# Patient Record
Sex: Female | Born: 1970 | Race: White | Hispanic: No | Marital: Married | Smoking: Current every day smoker
Health system: Southern US, Community
[De-identification: ages and names within clinical notes are randomized; demographics above are authoritative.]

## PROBLEM LIST (undated history)

## (undated) DIAGNOSIS — N926 Irregular menstruation, unspecified: Secondary | ICD-10-CM

## (undated) DIAGNOSIS — Z8719 Personal history of other diseases of the digestive system: Secondary | ICD-10-CM

## (undated) DIAGNOSIS — E049 Nontoxic goiter, unspecified: Secondary | ICD-10-CM

## (undated) DIAGNOSIS — E162 Hypoglycemia, unspecified: Secondary | ICD-10-CM

## (undated) DIAGNOSIS — IMO0001 Reserved for inherently not codable concepts without codable children: Secondary | ICD-10-CM

## (undated) DIAGNOSIS — E785 Hyperlipidemia, unspecified: Secondary | ICD-10-CM

## (undated) DIAGNOSIS — I10 Essential (primary) hypertension: Secondary | ICD-10-CM

## (undated) DIAGNOSIS — K2270 Barrett's esophagus without dysplasia: Secondary | ICD-10-CM

## (undated) DIAGNOSIS — K449 Diaphragmatic hernia without obstruction or gangrene: Secondary | ICD-10-CM

## (undated) DIAGNOSIS — I7300 Raynaud's syndrome without gangrene: Secondary | ICD-10-CM

## (undated) DIAGNOSIS — E282 Polycystic ovarian syndrome: Secondary | ICD-10-CM

## (undated) DIAGNOSIS — Z9884 Bariatric surgery status: Secondary | ICD-10-CM

## (undated) DIAGNOSIS — K219 Gastro-esophageal reflux disease without esophagitis: Secondary | ICD-10-CM

## (undated) DIAGNOSIS — G4733 Obstructive sleep apnea (adult) (pediatric): Secondary | ICD-10-CM

## (undated) DIAGNOSIS — K7689 Other specified diseases of liver: Secondary | ICD-10-CM

## (undated) DIAGNOSIS — M1990 Unspecified osteoarthritis, unspecified site: Secondary | ICD-10-CM

## (undated) DIAGNOSIS — T7840XA Allergy, unspecified, initial encounter: Secondary | ICD-10-CM

## (undated) DIAGNOSIS — D509 Iron deficiency anemia, unspecified: Secondary | ICD-10-CM

## (undated) HISTORY — DX: Gastro-esophageal reflux disease without esophagitis: K21.9

## (undated) HISTORY — DX: Reserved for inherently not codable concepts without codable children: IMO0001

## (undated) HISTORY — DX: Essential (primary) hypertension: I10

## (undated) HISTORY — DX: Nontoxic goiter, unspecified: E04.9

## (undated) HISTORY — DX: Bariatric surgery status: Z98.84

## (undated) HISTORY — DX: Personal history of other diseases of the digestive system: Z87.19

## (undated) HISTORY — DX: Diaphragmatic hernia without obstruction or gangrene: K44.9

## (undated) HISTORY — DX: Obstructive sleep apnea (adult) (pediatric): G47.33

## (undated) HISTORY — DX: Polycystic ovarian syndrome: E28.2

## (undated) HISTORY — DX: Allergy, unspecified, initial encounter: T78.40XA

## (undated) HISTORY — DX: Iron deficiency anemia, unspecified: D50.9

## (undated) HISTORY — DX: Hyperlipidemia, unspecified: E78.5

## (undated) HISTORY — DX: Raynaud's syndrome without gangrene: I73.00

## (undated) HISTORY — DX: Barrett's esophagus without dysplasia: K22.70

## (undated) HISTORY — DX: Irregular menstruation, unspecified: N92.6

## (undated) HISTORY — DX: Hypoglycemia, unspecified: E16.2

## (undated) HISTORY — DX: Other specified diseases of liver: K76.89

## (undated) HISTORY — PX: NISSEN FUNDOPLICATION: SHX2091

## (undated) HISTORY — DX: Unspecified osteoarthritis, unspecified site: M19.90

---

## 1998-05-23 ENCOUNTER — Emergency Department (HOSPITAL_COMMUNITY): Admission: EM | Admit: 1998-05-23 | Discharge: 1998-05-23 | Payer: Self-pay | Admitting: Emergency Medicine

## 1999-02-01 ENCOUNTER — Encounter: Payer: Self-pay | Admitting: Family Medicine

## 1999-02-01 ENCOUNTER — Ambulatory Visit (HOSPITAL_COMMUNITY): Admission: RE | Admit: 1999-02-01 | Discharge: 1999-02-01 | Payer: Self-pay | Admitting: Family Medicine

## 1999-05-08 ENCOUNTER — Ambulatory Visit (HOSPITAL_COMMUNITY): Admission: RE | Admit: 1999-05-08 | Discharge: 1999-05-08 | Payer: Self-pay | Admitting: *Deleted

## 1999-05-10 ENCOUNTER — Emergency Department (HOSPITAL_COMMUNITY): Admission: EM | Admit: 1999-05-10 | Discharge: 1999-05-11 | Payer: Self-pay

## 2001-01-05 ENCOUNTER — Encounter: Admission: RE | Admit: 2001-01-05 | Discharge: 2001-01-05 | Payer: Self-pay | Admitting: Family Medicine

## 2001-01-05 ENCOUNTER — Encounter: Payer: Self-pay | Admitting: Family Medicine

## 2002-11-22 ENCOUNTER — Encounter: Admission: RE | Admit: 2002-11-22 | Discharge: 2003-02-20 | Payer: Self-pay | Admitting: Family Medicine

## 2002-12-27 ENCOUNTER — Ambulatory Visit (HOSPITAL_COMMUNITY): Admission: RE | Admit: 2002-12-27 | Discharge: 2002-12-27 | Payer: Self-pay | Admitting: Gastroenterology

## 2003-01-16 ENCOUNTER — Ambulatory Visit (HOSPITAL_COMMUNITY): Admission: RE | Admit: 2003-01-16 | Discharge: 2003-01-16 | Payer: Self-pay | Admitting: Gastroenterology

## 2003-03-13 ENCOUNTER — Encounter: Admission: RE | Admit: 2003-03-13 | Discharge: 2003-06-11 | Payer: Self-pay | Admitting: Family Medicine

## 2003-04-02 ENCOUNTER — Encounter: Payer: Self-pay | Admitting: Surgery

## 2003-04-04 ENCOUNTER — Inpatient Hospital Stay (HOSPITAL_COMMUNITY): Admission: RE | Admit: 2003-04-04 | Discharge: 2003-04-06 | Payer: Self-pay | Admitting: Surgery

## 2004-08-26 ENCOUNTER — Ambulatory Visit: Payer: Self-pay | Admitting: Family Medicine

## 2004-12-03 ENCOUNTER — Ambulatory Visit: Payer: Self-pay | Admitting: Family Medicine

## 2005-01-12 ENCOUNTER — Ambulatory Visit (HOSPITAL_BASED_OUTPATIENT_CLINIC_OR_DEPARTMENT_OTHER): Admission: RE | Admit: 2005-01-12 | Discharge: 2005-01-12 | Payer: Self-pay | Admitting: Family Medicine

## 2005-01-22 ENCOUNTER — Ambulatory Visit: Payer: Self-pay | Admitting: Pulmonary Disease

## 2005-01-26 ENCOUNTER — Ambulatory Visit: Payer: Self-pay | Admitting: Family Medicine

## 2005-02-02 ENCOUNTER — Ambulatory Visit: Payer: Self-pay | Admitting: Family Medicine

## 2005-02-17 ENCOUNTER — Ambulatory Visit: Payer: Self-pay | Admitting: *Deleted

## 2005-02-26 ENCOUNTER — Ambulatory Visit: Payer: Self-pay | Admitting: Family Medicine

## 2005-02-27 ENCOUNTER — Ambulatory Visit: Payer: Self-pay | Admitting: Internal Medicine

## 2005-03-18 ENCOUNTER — Ambulatory Visit (HOSPITAL_BASED_OUTPATIENT_CLINIC_OR_DEPARTMENT_OTHER): Admission: RE | Admit: 2005-03-18 | Discharge: 2005-03-18 | Payer: Self-pay | Admitting: Internal Medicine

## 2005-03-22 ENCOUNTER — Ambulatory Visit: Payer: Self-pay | Admitting: Internal Medicine

## 2005-04-01 ENCOUNTER — Ambulatory Visit: Payer: Self-pay | Admitting: Internal Medicine

## 2005-04-29 ENCOUNTER — Ambulatory Visit: Payer: Self-pay | Admitting: Internal Medicine

## 2005-06-24 ENCOUNTER — Ambulatory Visit: Payer: Self-pay | Admitting: Internal Medicine

## 2005-07-06 ENCOUNTER — Ambulatory Visit: Payer: Self-pay | Admitting: Family Medicine

## 2005-07-20 ENCOUNTER — Ambulatory Visit: Payer: Self-pay | Admitting: Family Medicine

## 2005-07-31 ENCOUNTER — Ambulatory Visit: Payer: Self-pay | Admitting: Family Medicine

## 2005-08-05 ENCOUNTER — Ambulatory Visit: Payer: Self-pay | Admitting: Family Medicine

## 2005-08-24 ENCOUNTER — Ambulatory Visit: Payer: Self-pay | Admitting: Family Medicine

## 2005-09-10 ENCOUNTER — Ambulatory Visit: Payer: Self-pay | Admitting: Family Medicine

## 2005-09-23 ENCOUNTER — Ambulatory Visit: Payer: Self-pay | Admitting: Family Medicine

## 2005-09-23 ENCOUNTER — Ambulatory Visit: Payer: Self-pay | Admitting: Internal Medicine

## 2005-09-23 ENCOUNTER — Encounter: Payer: Self-pay | Admitting: Gastroenterology

## 2005-10-05 ENCOUNTER — Ambulatory Visit: Payer: Self-pay | Admitting: Gastroenterology

## 2005-10-07 ENCOUNTER — Ambulatory Visit: Payer: Self-pay | Admitting: Cardiology

## 2005-11-13 ENCOUNTER — Ambulatory Visit: Payer: Self-pay | Admitting: Internal Medicine

## 2005-11-13 ENCOUNTER — Ambulatory Visit: Payer: Self-pay | Admitting: Family Medicine

## 2005-11-16 ENCOUNTER — Ambulatory Visit (HOSPITAL_COMMUNITY): Admission: RE | Admit: 2005-11-16 | Discharge: 2005-11-16 | Payer: Self-pay | Admitting: Surgery

## 2005-11-17 ENCOUNTER — Ambulatory Visit (HOSPITAL_COMMUNITY): Admission: RE | Admit: 2005-11-17 | Discharge: 2005-11-17 | Payer: Self-pay | Admitting: Surgery

## 2005-11-17 ENCOUNTER — Encounter: Admission: RE | Admit: 2005-11-17 | Discharge: 2006-02-15 | Payer: Self-pay | Admitting: Surgery

## 2006-01-01 ENCOUNTER — Ambulatory Visit: Payer: Self-pay | Admitting: Family Medicine

## 2006-01-28 ENCOUNTER — Ambulatory Visit: Payer: Self-pay | Admitting: Family Medicine

## 2006-03-16 ENCOUNTER — Encounter: Admission: RE | Admit: 2006-03-16 | Discharge: 2006-04-09 | Payer: Self-pay | Admitting: Surgery

## 2006-03-25 ENCOUNTER — Ambulatory Visit: Payer: Self-pay | Admitting: Family Medicine

## 2006-03-26 HISTORY — PX: ROUX-EN-Y GASTRIC BYPASS: SHX1104

## 2006-03-26 HISTORY — PX: HERNIA REPAIR: SHX51

## 2006-03-29 ENCOUNTER — Inpatient Hospital Stay (HOSPITAL_COMMUNITY): Admission: RE | Admit: 2006-03-29 | Discharge: 2006-04-03 | Payer: Self-pay | Admitting: Surgery

## 2006-03-29 ENCOUNTER — Encounter: Payer: Self-pay | Admitting: Gastroenterology

## 2006-03-29 ENCOUNTER — Encounter (INDEPENDENT_AMBULATORY_CARE_PROVIDER_SITE_OTHER): Payer: Self-pay | Admitting: *Deleted

## 2006-03-30 ENCOUNTER — Encounter: Payer: Self-pay | Admitting: Vascular Surgery

## 2006-04-08 ENCOUNTER — Ambulatory Visit: Payer: Self-pay | Admitting: Family Medicine

## 2006-05-12 ENCOUNTER — Ambulatory Visit: Payer: Self-pay | Admitting: Internal Medicine

## 2006-05-13 ENCOUNTER — Ambulatory Visit: Payer: Self-pay | Admitting: Family Medicine

## 2006-07-30 ENCOUNTER — Encounter: Admission: RE | Admit: 2006-07-30 | Discharge: 2006-10-28 | Payer: Self-pay | Admitting: Surgery

## 2006-08-02 ENCOUNTER — Ambulatory Visit: Payer: Self-pay | Admitting: Family Medicine

## 2006-08-02 ENCOUNTER — Encounter: Admission: RE | Admit: 2006-08-02 | Discharge: 2006-08-02 | Payer: Self-pay | Admitting: Family Medicine

## 2006-08-12 ENCOUNTER — Ambulatory Visit: Payer: Self-pay | Admitting: Internal Medicine

## 2006-09-02 ENCOUNTER — Ambulatory Visit: Payer: Self-pay | Admitting: Family Medicine

## 2006-11-05 ENCOUNTER — Ambulatory Visit: Payer: Self-pay | Admitting: Family Medicine

## 2006-11-17 ENCOUNTER — Ambulatory Visit: Payer: Self-pay | Admitting: Family Medicine

## 2006-11-17 LAB — CONVERTED CEMR LAB
ALT: 25 units/L (ref 0–40)
AST: 22 units/L (ref 0–37)
Albumin: 3.6 g/dL (ref 3.5–5.2)
Basophils Relative: 0.4 % (ref 0.0–1.0)
CO2: 29 meq/L (ref 19–32)
Calcium: 9.5 mg/dL (ref 8.4–10.5)
Chloride: 106 meq/L (ref 96–112)
Direct LDL: 151.6 mg/dL
Eosinophils Relative: 1.6 % (ref 0.0–5.0)
Folate: 11.6 ng/mL
GFR calc non Af Amer: 87 mL/min
Glucose, Bld: 82 mg/dL (ref 70–99)
Hemoglobin: 14.9 g/dL (ref 12.0–15.0)
MCHC: 35 g/dL (ref 30.0–36.0)
Monocytes Absolute: 0.4 10*3/uL (ref 0.2–0.7)
Platelets: 197 10*3/uL (ref 150–400)
Potassium: 3.6 meq/L (ref 3.5–5.1)
Sodium: 139 meq/L (ref 135–145)
TSH: 2.76 microintl units/mL (ref 0.35–5.50)
Transferrin: 228.4 mg/dL (ref >=212.0)
Triglycerides: 134 mg/dL (ref 0–149)
VLDL: 27 mg/dL (ref 0–40)
Vitamin B-12: 841 pg/mL (ref 211–911)

## 2006-11-24 DIAGNOSIS — IMO0001 Reserved for inherently not codable concepts without codable children: Secondary | ICD-10-CM

## 2006-11-24 DIAGNOSIS — K219 Gastro-esophageal reflux disease without esophagitis: Secondary | ICD-10-CM

## 2006-11-24 DIAGNOSIS — G4733 Obstructive sleep apnea (adult) (pediatric): Secondary | ICD-10-CM

## 2006-11-24 DIAGNOSIS — E282 Polycystic ovarian syndrome: Secondary | ICD-10-CM

## 2006-11-24 DIAGNOSIS — Z9884 Bariatric surgery status: Secondary | ICD-10-CM

## 2006-11-24 HISTORY — DX: Bariatric surgery status: Z98.84

## 2006-11-24 HISTORY — DX: Polycystic ovarian syndrome: E28.2

## 2006-11-24 HISTORY — DX: Gastro-esophageal reflux disease without esophagitis: K21.9

## 2006-11-24 HISTORY — DX: Obstructive sleep apnea (adult) (pediatric): G47.33

## 2006-11-24 HISTORY — DX: Reserved for inherently not codable concepts without codable children: IMO0001

## 2006-11-30 ENCOUNTER — Ambulatory Visit: Payer: Self-pay | Admitting: Family Medicine

## 2006-11-30 LAB — CONVERTED CEMR LAB: Cyclic Citrullin Peptide Ab: 4 units (ref <20)

## 2006-12-28 ENCOUNTER — Ambulatory Visit: Payer: Self-pay | Admitting: Family Medicine

## 2006-12-28 ENCOUNTER — Encounter: Admission: RE | Admit: 2006-12-28 | Discharge: 2006-12-28 | Payer: Self-pay | Admitting: Family Medicine

## 2006-12-28 LAB — CONVERTED CEMR LAB: Vit D, 1,25-Dihydroxy: 31 (ref 20–57)

## 2007-03-02 ENCOUNTER — Encounter: Admission: RE | Admit: 2007-03-02 | Discharge: 2007-03-02 | Payer: Self-pay | Admitting: Surgery

## 2007-03-23 ENCOUNTER — Encounter: Payer: Self-pay | Admitting: Family Medicine

## 2007-04-20 ENCOUNTER — Encounter: Admission: RE | Admit: 2007-04-20 | Discharge: 2007-04-20 | Payer: Self-pay | Admitting: Surgery

## 2007-05-10 ENCOUNTER — Ambulatory Visit: Payer: Self-pay | Admitting: Family Medicine

## 2007-05-10 DIAGNOSIS — N390 Urinary tract infection, site not specified: Secondary | ICD-10-CM | POA: Insufficient documentation

## 2007-05-10 DIAGNOSIS — L740 Miliaria rubra: Secondary | ICD-10-CM

## 2007-05-10 DIAGNOSIS — N926 Irregular menstruation, unspecified: Secondary | ICD-10-CM

## 2007-05-10 HISTORY — DX: Irregular menstruation, unspecified: N92.6

## 2007-05-10 LAB — CONVERTED CEMR LAB
Beta hcg, urine, semiquantitative: NEGATIVE
Bilirubin Urine: NEGATIVE

## 2007-05-13 ENCOUNTER — Telehealth (INDEPENDENT_AMBULATORY_CARE_PROVIDER_SITE_OTHER): Payer: Self-pay | Admitting: *Deleted

## 2007-05-13 ENCOUNTER — Encounter (INDEPENDENT_AMBULATORY_CARE_PROVIDER_SITE_OTHER): Payer: Self-pay | Admitting: *Deleted

## 2007-05-13 LAB — CONVERTED CEMR LAB
Calcium: 9.4 mg/dL (ref 8.4–10.5)
HCT: 42.3 % (ref 36.0–46.0)
Hemoglobin: 14.5 g/dL (ref 12.0–15.0)
Neutro Abs: 4.2 10*3/uL (ref 1.4–7.7)
Neutrophils Relative %: 58.5 % (ref 43.0–77.0)
Platelets: 203 10*3/uL (ref 150–400)
Potassium: 4.2 meq/L (ref 3.5–5.1)
TSH: 1.47 microintl units/mL (ref 0.35–5.50)
WBC: 7.1 10*3/uL (ref 4.5–10.5)

## 2007-07-28 ENCOUNTER — Encounter: Payer: Self-pay | Admitting: Family Medicine

## 2007-09-16 ENCOUNTER — Ambulatory Visit: Payer: Self-pay | Admitting: Family Medicine

## 2007-09-16 DIAGNOSIS — R5383 Other fatigue: Secondary | ICD-10-CM

## 2007-09-16 DIAGNOSIS — E049 Nontoxic goiter, unspecified: Secondary | ICD-10-CM | POA: Insufficient documentation

## 2007-09-16 DIAGNOSIS — R5381 Other malaise: Secondary | ICD-10-CM

## 2007-09-16 HISTORY — DX: Nontoxic goiter, unspecified: E04.9

## 2007-09-19 ENCOUNTER — Encounter (INDEPENDENT_AMBULATORY_CARE_PROVIDER_SITE_OTHER): Payer: Self-pay | Admitting: *Deleted

## 2007-09-19 LAB — CONVERTED CEMR LAB
BUN: 13 mg/dL (ref 6–23)
CO2: 25 meq/L (ref 19–32)
Calcium: 9 mg/dL (ref 8.4–10.5)
Creatinine, Ser: 0.73 mg/dL (ref 0.40–1.20)
Folate: >20 ng/mL
Free T4: 1.24 ng/dL (ref 0.89–1.80)
T3, Free: 2.9 pg/mL (ref 2.3–4.2)
TSH: 2.335 microintl units/mL (ref 0.350–5.50)
Vitamin B-12: 867 pg/mL (ref 211–911)

## 2007-09-20 ENCOUNTER — Encounter: Admission: RE | Admit: 2007-09-20 | Discharge: 2007-09-20 | Payer: Self-pay | Admitting: Family Medicine

## 2007-09-23 ENCOUNTER — Telehealth (INDEPENDENT_AMBULATORY_CARE_PROVIDER_SITE_OTHER): Payer: Self-pay | Admitting: *Deleted

## 2007-10-28 ENCOUNTER — Telehealth: Payer: Self-pay | Admitting: Internal Medicine

## 2007-11-08 ENCOUNTER — Encounter: Payer: Self-pay | Admitting: Family Medicine

## 2007-11-30 ENCOUNTER — Ambulatory Visit (HOSPITAL_BASED_OUTPATIENT_CLINIC_OR_DEPARTMENT_OTHER): Admission: RE | Admit: 2007-11-30 | Discharge: 2007-11-30 | Payer: Self-pay | Admitting: Rheumatology

## 2007-12-03 ENCOUNTER — Ambulatory Visit: Payer: Self-pay | Admitting: Internal Medicine

## 2008-01-09 ENCOUNTER — Encounter: Payer: Self-pay | Admitting: Gastroenterology

## 2008-01-11 ENCOUNTER — Ambulatory Visit: Payer: Self-pay | Admitting: Family Medicine

## 2008-01-11 DIAGNOSIS — E785 Hyperlipidemia, unspecified: Secondary | ICD-10-CM

## 2008-01-11 HISTORY — DX: Hyperlipidemia, unspecified: E78.5

## 2008-01-11 LAB — CONVERTED CEMR LAB
Bilirubin Urine: NEGATIVE
Ketones, urine, test strip: NEGATIVE
Nitrite: POSITIVE
Specific Gravity, Urine: <1.005
Urobilinogen, UA: NEGATIVE

## 2008-01-12 ENCOUNTER — Encounter: Payer: Self-pay | Admitting: Family Medicine

## 2008-01-13 ENCOUNTER — Telehealth (INDEPENDENT_AMBULATORY_CARE_PROVIDER_SITE_OTHER): Payer: Self-pay | Admitting: *Deleted

## 2008-01-20 ENCOUNTER — Telehealth (INDEPENDENT_AMBULATORY_CARE_PROVIDER_SITE_OTHER): Payer: Self-pay | Admitting: *Deleted

## 2008-01-22 LAB — CONVERTED CEMR LAB
ALT: 15 units/L (ref 0–35)
AST: 23 units/L (ref 0–37)
Albumin: 3.4 g/dL — ABNORMAL LOW (ref 3.5–5.2)
Basophils Absolute: 0.1 10*3/uL (ref 0.0–0.1)
Bilirubin, Direct: 0.1 mg/dL (ref 0.0–0.3)
Chloride: 105 meq/L (ref 96–112)
Creatinine, Ser: 0.8 mg/dL (ref 0.4–1.2)
Folate: >20 ng/mL
GFR calc non Af Amer: 86 mL/min
Glucose, Bld: 81 mg/dL (ref 70–99)
HDL: 44.9 mg/dL (ref >39.0)
MCHC: 33.4 g/dL (ref 30.0–36.0)
Monocytes Relative: 6.7 % (ref 3.0–11.0)
Neutro Abs: 3.3 10*3/uL (ref 1.4–7.7)
Neutrophils Relative %: 50.9 % (ref 43.0–77.0)
Platelets: 192 10*3/uL (ref 150–400)
RBC: 4.82 M/uL (ref 3.87–5.11)
RDW: 12.9 % (ref 11.5–14.6)
Total Bilirubin: 0.8 mg/dL (ref 0.3–1.2)
Total Protein: 6.7 g/dL (ref 6.0–8.3)
Triglycerides: 144 mg/dL (ref 0–149)
VLDL: 29 mg/dL (ref 0–40)

## 2008-01-23 ENCOUNTER — Encounter (INDEPENDENT_AMBULATORY_CARE_PROVIDER_SITE_OTHER): Payer: Self-pay | Admitting: *Deleted

## 2008-02-23 DIAGNOSIS — Z8719 Personal history of other diseases of the digestive system: Secondary | ICD-10-CM

## 2008-02-23 HISTORY — DX: Personal history of other diseases of the digestive system: Z87.19

## 2008-03-06 ENCOUNTER — Ambulatory Visit: Payer: Self-pay | Admitting: Gastroenterology

## 2008-03-06 DIAGNOSIS — K5900 Constipation, unspecified: Secondary | ICD-10-CM | POA: Insufficient documentation

## 2008-03-06 DIAGNOSIS — M533 Sacrococcygeal disorders, not elsewhere classified: Secondary | ICD-10-CM | POA: Insufficient documentation

## 2008-03-28 ENCOUNTER — Ambulatory Visit: Payer: Self-pay | Admitting: Gastroenterology

## 2008-06-14 ENCOUNTER — Ambulatory Visit: Payer: Self-pay | Admitting: Gastroenterology

## 2008-06-14 DIAGNOSIS — K7689 Other specified diseases of liver: Secondary | ICD-10-CM

## 2008-06-14 DIAGNOSIS — R1033 Periumbilical pain: Secondary | ICD-10-CM | POA: Insufficient documentation

## 2008-06-14 HISTORY — DX: Other specified diseases of liver: K76.89

## 2008-06-18 ENCOUNTER — Ambulatory Visit: Payer: Self-pay | Admitting: Family Medicine

## 2008-06-19 LAB — CONVERTED CEMR LAB: Vit D, 1,25-Dihydroxy: 33 (ref 30–89)

## 2008-06-20 ENCOUNTER — Encounter (INDEPENDENT_AMBULATORY_CARE_PROVIDER_SITE_OTHER): Payer: Self-pay | Admitting: *Deleted

## 2008-06-21 ENCOUNTER — Ambulatory Visit: Payer: Self-pay | Admitting: Gastroenterology

## 2008-06-21 ENCOUNTER — Encounter: Payer: Self-pay | Admitting: Gastroenterology

## 2008-06-22 ENCOUNTER — Encounter: Payer: Self-pay | Admitting: Gastroenterology

## 2008-06-25 LAB — CONVERTED CEMR LAB
Bilirubin, Direct: 0.1 mg/dL (ref 0.0–0.3)
Chloride: 110 meq/L (ref 96–112)
Direct LDL: 174.6 mg/dL
Eosinophils Absolute: 0.1 10*3/uL (ref 0.0–0.7)
GFR calc Af Amer: 104 mL/min
GFR calc non Af Amer: 86 mL/min
HDL: 48.6 mg/dL (ref >39.0)
Hemoglobin: 14.5 g/dL (ref 12.0–15.0)
MCHC: 34.5 g/dL (ref 30.0–36.0)
Neutro Abs: 2.8 10*3/uL (ref 1.4–7.7)
Platelets: 173 10*3/uL (ref 150–400)
RBC: 4.4 M/uL (ref 3.87–5.11)
Saturation Ratios: 25.1 % (ref 20.0–50.0)
VLDL: 22 mg/dL (ref 0–40)
WBC: 5.8 10*3/uL (ref 4.5–10.5)

## 2008-06-26 DIAGNOSIS — K2970 Gastritis, unspecified, without bleeding: Secondary | ICD-10-CM | POA: Insufficient documentation

## 2008-06-26 DIAGNOSIS — K449 Diaphragmatic hernia without obstruction or gangrene: Secondary | ICD-10-CM | POA: Insufficient documentation

## 2008-06-26 DIAGNOSIS — K2990 Gastroduodenitis, unspecified, without bleeding: Secondary | ICD-10-CM

## 2008-06-26 HISTORY — DX: Diaphragmatic hernia without obstruction or gangrene: K44.9

## 2008-07-24 ENCOUNTER — Encounter: Admission: RE | Admit: 2008-07-24 | Discharge: 2008-07-24 | Payer: Self-pay | Admitting: Surgery

## 2008-09-17 ENCOUNTER — Telehealth: Payer: Self-pay | Admitting: Gastroenterology

## 2008-10-10 ENCOUNTER — Encounter: Payer: Self-pay | Admitting: Family Medicine

## 2008-11-16 ENCOUNTER — Ambulatory Visit (HOSPITAL_COMMUNITY): Admission: RE | Admit: 2008-11-16 | Discharge: 2008-11-16 | Payer: Self-pay | Admitting: Obstetrics and Gynecology

## 2008-12-04 ENCOUNTER — Telehealth (INDEPENDENT_AMBULATORY_CARE_PROVIDER_SITE_OTHER): Payer: Self-pay | Admitting: *Deleted

## 2009-01-16 ENCOUNTER — Encounter: Payer: Self-pay | Admitting: Family Medicine

## 2009-01-17 ENCOUNTER — Encounter: Admission: RE | Admit: 2009-01-17 | Discharge: 2009-01-17 | Payer: Self-pay | Admitting: Internal Medicine

## 2009-02-15 ENCOUNTER — Telehealth (INDEPENDENT_AMBULATORY_CARE_PROVIDER_SITE_OTHER): Payer: Self-pay | Admitting: *Deleted

## 2009-02-18 ENCOUNTER — Encounter (INDEPENDENT_AMBULATORY_CARE_PROVIDER_SITE_OTHER): Payer: Self-pay | Admitting: Obstetrics and Gynecology

## 2009-02-18 HISTORY — PX: ABDOMINAL HYSTERECTOMY: SHX81

## 2009-02-18 HISTORY — PX: BLADDER SUSPENSION: SHX72

## 2009-02-19 ENCOUNTER — Inpatient Hospital Stay (HOSPITAL_COMMUNITY): Admission: RE | Admit: 2009-02-19 | Discharge: 2009-02-20 | Payer: Self-pay | Admitting: Obstetrics and Gynecology

## 2009-03-18 ENCOUNTER — Telehealth (INDEPENDENT_AMBULATORY_CARE_PROVIDER_SITE_OTHER): Payer: Self-pay | Admitting: *Deleted

## 2009-06-05 ENCOUNTER — Encounter: Payer: Self-pay | Admitting: Gastroenterology

## 2009-06-21 ENCOUNTER — Encounter: Admission: RE | Admit: 2009-06-21 | Discharge: 2009-06-21 | Payer: Self-pay | Admitting: Obstetrics and Gynecology

## 2009-06-25 ENCOUNTER — Ambulatory Visit: Payer: Self-pay | Admitting: Family Medicine

## 2009-06-25 DIAGNOSIS — R519 Headache, unspecified: Secondary | ICD-10-CM | POA: Insufficient documentation

## 2009-06-25 DIAGNOSIS — N3010 Interstitial cystitis (chronic) without hematuria: Secondary | ICD-10-CM | POA: Insufficient documentation

## 2009-06-25 DIAGNOSIS — R209 Unspecified disturbances of skin sensation: Secondary | ICD-10-CM

## 2009-06-25 DIAGNOSIS — R197 Diarrhea, unspecified: Secondary | ICD-10-CM

## 2009-06-25 DIAGNOSIS — R51 Headache: Secondary | ICD-10-CM

## 2009-07-02 ENCOUNTER — Encounter (INDEPENDENT_AMBULATORY_CARE_PROVIDER_SITE_OTHER): Payer: Self-pay | Admitting: *Deleted

## 2009-07-02 LAB — CONVERTED CEMR LAB
AST: 20 units/L (ref 0–37)
BUN: 13 mg/dL (ref 6–23)
Basophils Relative: 0.2 % (ref 0.0–3.0)
Chloride: 104 meq/L (ref 96–112)
Direct LDL: 157.1 mg/dL
Eosinophils Absolute: 0.3 10*3/uL (ref 0.0–0.7)
GFR calc non Af Amer: 99.77 mL/min (ref >60)
Iron: 80 ug/dL (ref 42–145)
MCHC: 33.5 g/dL (ref 30.0–36.0)
Neutro Abs: 1.4 10*3/uL (ref 1.4–7.7)
Neutrophils Relative %: 30.9 % — ABNORMAL LOW (ref 43.0–77.0)
Platelets: 179 10*3/uL (ref 150.0–400.0)
RBC: 4.31 M/uL (ref 3.87–5.11)
RDW: 11.8 % (ref 11.5–14.6)
Total Bilirubin: 0.9 mg/dL (ref 0.3–1.2)
Total CHOL/HDL Ratio: 3
Total Protein: 6.7 g/dL (ref 6.0–8.3)
Transferrin: 255.2 mg/dL (ref 212.0–360.0)
Triglycerides: 79 mg/dL (ref 0.0–149.0)
Vitamin B-12: 1073 pg/mL — ABNORMAL HIGH (ref 211–911)
WBC: 4.6 10*3/uL (ref 4.5–10.5)

## 2009-07-03 ENCOUNTER — Ambulatory Visit: Payer: Self-pay | Admitting: Family Medicine

## 2009-07-03 ENCOUNTER — Encounter (INDEPENDENT_AMBULATORY_CARE_PROVIDER_SITE_OTHER): Payer: Self-pay | Admitting: *Deleted

## 2009-07-03 LAB — CONVERTED CEMR LAB
OCCULT 1: NEGATIVE
OCCULT 2: NEGATIVE
OCCULT 3: NEGATIVE

## 2009-07-04 ENCOUNTER — Encounter: Payer: Self-pay | Admitting: Family Medicine

## 2009-07-05 ENCOUNTER — Telehealth (INDEPENDENT_AMBULATORY_CARE_PROVIDER_SITE_OTHER): Payer: Self-pay | Admitting: *Deleted

## 2009-07-08 ENCOUNTER — Encounter: Payer: Self-pay | Admitting: Family Medicine

## 2009-07-09 ENCOUNTER — Encounter: Payer: Self-pay | Admitting: Family Medicine

## 2009-08-06 ENCOUNTER — Encounter: Payer: Self-pay | Admitting: Family Medicine

## 2009-08-23 ENCOUNTER — Encounter: Payer: Self-pay | Admitting: Family Medicine

## 2009-08-30 ENCOUNTER — Encounter: Payer: Self-pay | Admitting: Family Medicine

## 2009-09-05 ENCOUNTER — Encounter: Payer: Self-pay | Admitting: Family Medicine

## 2009-09-26 ENCOUNTER — Encounter: Payer: Self-pay | Admitting: Family Medicine

## 2009-11-07 ENCOUNTER — Encounter: Payer: Self-pay | Admitting: Family Medicine

## 2009-12-05 ENCOUNTER — Encounter: Payer: Self-pay | Admitting: Family Medicine

## 2010-01-07 ENCOUNTER — Encounter: Payer: Self-pay | Admitting: Family Medicine

## 2010-01-28 ENCOUNTER — Ambulatory Visit: Payer: Self-pay | Admitting: Family Medicine

## 2010-01-28 DIAGNOSIS — I7300 Raynaud's syndrome without gangrene: Secondary | ICD-10-CM

## 2010-01-28 DIAGNOSIS — H6590 Unspecified nonsuppurative otitis media, unspecified ear: Secondary | ICD-10-CM | POA: Insufficient documentation

## 2010-01-28 HISTORY — DX: Raynaud's syndrome without gangrene: I73.00

## 2010-01-30 LAB — CONVERTED CEMR LAB
BUN: 7 mg/dL (ref 6–23)
Basophils Relative: 0.4 % (ref 0.0–3.0)
Eosinophils Absolute: 0.2 10*3/uL (ref 0.0–0.7)
Ferritin: 45.9 ng/mL (ref 10.0–291.0)
Glucose, Bld: 88 mg/dL (ref 70–99)
Lymphocytes Relative: 42.9 % (ref 12.0–46.0)
MCV: 92.9 fL (ref 78.0–100.0)
Neutro Abs: 2.2 10*3/uL (ref 1.4–7.7)
Neutrophils Relative %: 44.4 % (ref 43.0–77.0)
Platelets: 179 10*3/uL (ref 150.0–400.0)
RDW: 14.2 % (ref 11.5–14.6)
Rhuematoid fact SerPl-aCnc: 25 intl units/mL — ABNORMAL HIGH (ref 0.0–20.0)
Saturation Ratios: 16.6 % — ABNORMAL LOW (ref 20.0–50.0)
WBC: 4.9 10*3/uL (ref 4.5–10.5)

## 2010-02-10 ENCOUNTER — Ambulatory Visit: Payer: Self-pay | Admitting: Gastroenterology

## 2010-02-10 DIAGNOSIS — K2270 Barrett's esophagus without dysplasia: Secondary | ICD-10-CM | POA: Insufficient documentation

## 2010-02-10 DIAGNOSIS — D509 Iron deficiency anemia, unspecified: Secondary | ICD-10-CM

## 2010-02-10 HISTORY — DX: Iron deficiency anemia, unspecified: D50.9

## 2010-02-10 HISTORY — DX: Barrett's esophagus without dysplasia: K22.70

## 2010-03-04 ENCOUNTER — Encounter: Payer: Self-pay | Admitting: Family Medicine

## 2010-03-06 ENCOUNTER — Encounter: Payer: Self-pay | Admitting: Family Medicine

## 2010-04-29 ENCOUNTER — Encounter: Payer: Self-pay | Admitting: Family Medicine

## 2010-06-03 ENCOUNTER — Encounter: Payer: Self-pay | Admitting: Family Medicine

## 2010-07-29 ENCOUNTER — Ambulatory Visit: Payer: Self-pay | Admitting: Family Medicine

## 2010-07-29 DIAGNOSIS — J309 Allergic rhinitis, unspecified: Secondary | ICD-10-CM | POA: Insufficient documentation

## 2010-08-05 ENCOUNTER — Encounter: Payer: Self-pay | Admitting: Family Medicine

## 2010-08-11 ENCOUNTER — Telehealth: Payer: Self-pay | Admitting: Family Medicine

## 2010-09-09 ENCOUNTER — Encounter: Payer: Self-pay | Admitting: Family Medicine

## 2010-09-26 ENCOUNTER — Ambulatory Visit: Payer: Self-pay | Admitting: Family Medicine

## 2010-09-26 ENCOUNTER — Ambulatory Visit (HOSPITAL_BASED_OUTPATIENT_CLINIC_OR_DEPARTMENT_OTHER)
Admission: RE | Admit: 2010-09-26 | Discharge: 2010-09-26 | Payer: Self-pay | Source: Home / Self Care | Admitting: Family Medicine

## 2010-09-26 DIAGNOSIS — R079 Chest pain, unspecified: Secondary | ICD-10-CM | POA: Insufficient documentation

## 2010-10-02 ENCOUNTER — Encounter: Payer: Self-pay | Admitting: Family Medicine

## 2010-11-25 NOTE — Letter (Signed)
Summary: [facility]  [facility]   Imported By: Lanelle Bal 12/12/2009 12:52:51  _____________________________________________________________________  External Attachment:    Type:   Image     Comment:   External Document

## 2010-11-25 NOTE — Letter (Signed)
Summary: [facility]  [facility]   Imported By: Lanelle Bal 01/13/2010 12:32:47  _____________________________________________________________________  External Attachment:    Type:   Image     Comment:   External Document

## 2010-11-25 NOTE — Assessment & Plan Note (Signed)
Summary: FOR SINUS//PH   Vital Signs:  Patient profile:   40 year old female Weight:      108.2 pounds O2 Sat:      98 % on Room air Temp:     98.5 degrees F oral Pulse rate:   90 / minute Pulse rhythm:   regular BP sitting:   120 / 82  (left arm) Cuff size:   regular  Vitals Entered By: Almeta Monas CMA Duncan Dull) (July 29, 2010 3:01 PM)  O2 Flow:  Room air CC: c/o sinus pressure, congestion, cough with yellow phelgm and feeling tired, URI symptoms   History of Present Illness:       This is a 40 year old woman who presents with URI symptoms.  The symptoms began 2 weeks ago.  Pt has tried zyrtecD, zyrtec , mucinex with no relief. + max sinus pressure b/l .  The patient complains of nasal congestion, purulent nasal discharge, sore throat, productive cough, and sick contacts, but denies dry cough.  The patient denies fever, low-grade fever (<100.5 degrees), fever of 100.5-103 degrees, fever of 103.1-104 degrees, fever to >104 degrees, stiff neck, dyspnea, wheezing, rash, vomiting, diarrhea, use of an antipyretic, and response to antipyretic.  The patient also reports itchy watery eyes, itchy throat, headache, and severe fatigue.  The patient denies response to antihistamine and muscle aches.  The patient denies the following risk factors for Strep sinusitis: unilateral facial pain, unilateral nasal discharge, poor response to decongestant, double sickening, tooth pain, Strep exposure, tender adenopathy, and absence of cough.    Current Medications (verified): 1)  Ultram Er 100 Mg Xr24h-Tab (Tramadol Hcl) .... One Tablet By Mouth Three Times A Day 2)  Valtrex 1 Gm  Tabs (Valacyclovir Hcl) .Marland Kitchen.. 1 By Mouth Once Daily 3)  Skelaxin 800 Mg  Tabs (Metaxalone) .... One Tab By Mouth Two Times A Day As Needed 4)  Temazepam 7.5 Mg  Caps (Temazepam) .... Take 2 Tablets By Mouth At Bedtime 5)  Vitamin D 1000 Unit  Caps (Cholecalciferol) .... One Cap Two Times A Day 6)  Fish Oil 1200 Mg Caps (Omega-3  Fatty Acids) .... One Capsule By Mouth Two Times A Day 7)  Omeprazole 20 Mg  Cpdr (Omeprazole) .... One Tablet By Mouth Two Times A Day 8)  Elmiron 100 Mg Caps (Pentosan Polysulfate Sodium) .... 200mg  Two Times A Day 9)  Tums Calcium For Life Bone 750 Mg Chew (Calcium Carbonate Antacid) .... Two Tablets By Mouth At Bedtime 10)  Flector 1.3 % Ptch (Diclofenac Epolamine) .... Once Daily 11)  Vitamin E 600 Unit  Caps (Vitamin E) .... One Capsule By Mouth Once Daily 12)  Voltaren 1 % Gel (Diclofenac Sodium) .... As Directed 13)  Melatonin 5 Mg Tabs (Melatonin) .... One Tablet By Mouth At Bedtime 14)  Nucynta 50 Mg Tabs (Tapentadol Hcl) .... One Tablet By Mouth Three Times A Day 15)  Nuvigil 250 Mg Tabs (Armodafinil) .... As Needed Once Daily 16)  Vesicare 5 Mg Tabs (Solifenacin Succinate) .... By Mouth Daily 17)  Amitriptyline Hcl .... Pt Usure of Dose 18)  Testosterone .... Injections in Office 19)  Flonase 50 Mcg/act Susp (Fluticasone Propionate) .... 2  Sprays Each Nostril Once Daily 20)  Allegra Allergy 180 Mg Tabs (Fexofenadine Hcl) .Marland Kitchen.. 1 By Mouth Once Daily  Allergies (verified): 1)  ! Neurontin (Gabapentin) 2)  ! Nsaids 3)  ! Asa 4)  ! Cipro  Past History:  Past medical, surgical, family and  social histories (including risk factors) reviewed for relevance to current acute and chronic problems.  Past Medical History: Reviewed history from 02/10/2010 and no changes required. 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 Caesarean  section 1994 Roux-en-Y gastric bypass with takedown of Nissen fundoplication 03/2006 Umbilical hernia repair 03/2006 Nissen fundoplication Hysterectomy (02/18/2009)- TAH BSO-prolapse Bladder Sling--Dr MacDermot4/26/2010  Family History: Reviewed history from 06/14/2008 and no changes required. No FH of Colon Cancer: Family History of Pancreatic Cancer: Paternal Grandfather Family History of Diabetes: Father, Maternal Grandmother Family History of Heart Disease: Father  Social History: Reviewed history from 06/14/2008 and no changes required. Occupation: Unemployeed Patient is a former smoker. -stopped 2004 Alcohol Use - no Illicit Drug Use - no Patient does not get regular exercise.   Review of Systems      See HPI  Physical Exam  General:  Well-developed,well-nourished,in no acute distress; alert,appropriate and cooperative throughout examination Ears:  External ear exam shows no significant lesions or deformities.  Otoscopic examination reveals clear canals, tympanic membranes are intact bilaterally without bulging, retraction, inflammation or discharge. Hearing is grossly normal bilaterally. Nose:  mucosal erythema, mucosal edema, L maxillary sinus tenderness, and R maxillary sinus tenderness.   Mouth:  pharyngeal erythema.   Neck:  supple, full ROM, and cervical lymphadenopathy.   Lungs:  Normal respiratory effort, chest expands symmetrically. Lungs are clear to auscultation, no crackles or wheezes. Heart:  normal rate and no murmur.     Impression & Recommendations:  Problem # 1:  ALLERGIC RHINITIS CAUSE UNSPECIFIED (ICD-477.9)  The following medications were removed from the medication list:    Nasonex 50 Mcg/act Susp (Mometasone furoate) .Marland Kitchen... 2 sprays each nostril once daily Her updated medication list for this problem includes:    Flonase 50 Mcg/act Susp (Fluticasone propionate) .Marland Kitchen... 2  sprays each nostril once daily    Allegra Allergy 180 Mg Tabs (Fexofenadine  hcl) .Marland Kitchen... 1 by mouth once daily  Discussed use of allergy medications and environmental measures.   Orders: Admin of Therapeutic Inj  intramuscular or subcutaneous (52841) Depo- Medrol 80mg  (J1040)  Complete Medication List: 1)  Ultram Er 100 Mg Xr24h-tab (Tramadol hcl) .... One tablet by mouth three times a day 2)  Valtrex 1 Gm Tabs (Valacyclovir hcl) .Marland Kitchen.. 1 by mouth once daily 3)  Skelaxin 800 Mg Tabs (Metaxalone) .... One tab by mouth two times a day as needed 4)  Temazepam 7.5 Mg Caps (Temazepam) .... Take 2 tablets by mouth at bedtime 5)  Vitamin D 1000 Unit Caps (Cholecalciferol) .... One cap two times a day 6)  Fish Oil 1200 Mg Caps (Omega-3 fatty acids) .... One capsule by mouth two times a day 7)  Omeprazole 20 Mg Cpdr (Omeprazole) .... One tablet by mouth two times a day 8)  Elmiron 100 Mg Caps (Pentosan polysulfate sodium) .... 200mg  two times a day 9)  Tums Calcium For Life Bone 750 Mg Chew (Calcium carbonate antacid) .... Two tablets by mouth at bedtime 10)  Flector 1.3 % Ptch (Diclofenac epolamine) .... Once daily 11)  Vitamin E 600 Unit Caps (Vitamin e) .... One capsule by mouth once daily 12)  Voltaren 1 % Gel (Diclofenac sodium) .... As directed 13)  Melatonin  5 Mg Tabs (Melatonin) .... One tablet by mouth at bedtime 14)  Nucynta 50 Mg Tabs (Tapentadol hcl) .... One tablet by mouth three times a day 15)  Nuvigil 250 Mg Tabs (Armodafinil) .... As needed once daily 16)  Vesicare 5 Mg Tabs (Solifenacin succinate) .... By mouth daily 17)  Amitriptyline Hcl  .... Pt usure of dose 18)  Testosterone  .... Injections in office 19)  Flonase 50 Mcg/act Susp (Fluticasone propionate) .... 2  sprays each nostril once daily 20)  Allegra Allergy 180 Mg Tabs (Fexofenadine hcl) .Marland Kitchen.. 1 by mouth once daily  Patient Instructions: 1)  Get plenty of rest, drink lots of clear liquids, and use Tylenol or Ibuprofen for fever and comfort. Return in 7-10 days if you're not better: sooner if  you'er feeling worse.  Prescriptions: FLONASE 50 MCG/ACT SUSP (FLUTICASONE PROPIONATE) 2  sprays each nostril once daily  #1 x 1   Entered and Authorized by:   Loreen Freud DO   Signed by:   Loreen Freud DO on 07/29/2010   Method used:   [facility]  [address] [phone]ail[facility]       [address]       [place], [place]  [address]       Ph: [phone] or [phone]       Fax: [phone]   RxID:   [id number]    Medication Administration  Injection # 1:    Medication: Depo- Medrol 80mg     Diagnosis: ALLERGIC RHINITIS CAUSE UNSPECIFIED (ICD-477.9)    Route: IM    Site: RUOQ gluteus    Exp Date: 04/26/2011    Lot #: [id number]    Mfr: [employer]    Patient tolerated injection without complications    Given by: Jeremy Johann CMA (July 29, 2010 3:27 PM)  Orders Added: 1)  Admin of Therapeutic Inj  intramuscular or subcutaneous [96372] 2)  Depo- Medrol 80mg  [J1040] 3)  Est. Patient Level III [62831]

## 2010-11-25 NOTE — Op Note (Signed)
[facility]   Imported By: Sherian Rein 02/11/2010 10:38:53  _____________________________________________________________________  External Attachment:    Type:   Image     Comment:   External Document

## 2010-11-25 NOTE — Letter (Signed)
Summary: [facility]  [facility]   Imported By: Lanelle Bal 06/16/2010 10:06:10  _____________________________________________________________________  External Attachment:    Type:   Image     Comment:   External Document

## 2010-11-25 NOTE — Letter (Signed)
Summary: [facility]  [facility]   Imported By: Lanelle Bal 03/17/2010 09:50:13  _____________________________________________________________________  External Attachment:    Type:   Image     Comment:   External Document

## 2010-11-25 NOTE — Letter (Signed)
Summary: [facility]  [facility]   Imported By: Lanelle Bal 11/18/2009 09:12:27  _____________________________________________________________________  External Attachment:    Type:   Image     Comment:   External Document

## 2010-11-25 NOTE — Letter (Signed)
Summary: [facility]  [facility]   Imported By: Lanelle Bal 08/15/2010 11:31:50  _____________________________________________________________________  External Attachment:    Type:   Image     Comment:   External Document

## 2010-11-25 NOTE — Assessment & Plan Note (Signed)
Summary: evaluate Prilosec...as.   History of Present Illness Visit Type: Follow-up Visit Primary GI MD: Claudette Head, MD [facility] Primary Provider: Loreen Freud, DO Requesting Provider: n/a Chief Complaint: GERD and pt states that she needs Prilosec refilled  History of Present Illness:   This 40 year old female presents for followup of GERD. She states she is having frequent breakthrough symptoms late in the evening and early in the morning. She was recently found to have mild iron deficiency without anemia. Colonoscopy and EGD were performed in 2009.   GI Review of Systems    Reports heartburn.      Denies abdominal pain, acid reflux, belching, bloating, chest pain, dysphagia with liquids, dysphagia with solids, loss of appetite, nausea, vomiting, vomiting blood, weight loss, and  weight gain.        Denies anal fissure, black tarry stools, change in bowel habit, constipation, diarrhea, diverticulosis, fecal incontinence, heme positive stool, hemorrhoids, irritable bowel syndrome, jaundice, light color stool, liver problems, rectal bleeding, and  rectal pain.   Current Medications (verified): 1)  Ultram Er 100 Mg Xr24h-Tab (Tramadol Hcl) .... One Tablet By Mouth Three Times A Day 2)  Valtrex 1 Gm  Tabs (Valacyclovir Hcl) .Marland Kitchen.. 1 By Mouth Once Daily 3)  Skelaxin 800 Mg  Tabs (Metaxalone) .... One Tab By Mouth Two Times A Day As Needed 4)  Temazepam 7.5 Mg  Caps (Temazepam) .... Take 2 Tablets By Mouth At Bedtime 5)  Vitamin D 1000 Unit  Caps (Cholecalciferol) .... One Cap Two Times A Day 6)  Fish Oil 1200 Mg Caps (Omega-3 Fatty Acids) .... One Capsule By Mouth Two Times A Day 7)  Omeprazole 20 Mg  Cpdr (Omeprazole) .... One Capsule By Mouth Every Morning 8)  Boric Acid Capsulea .... Once Week Vagina 9)  Elmiron 100 Mg Caps (Pentosan Polysulfate Sodium) .... 200mg  Two Times A Day 10)  Tums Calcium For Life Bone 750 Mg Chew (Calcium Carbonate Antacid) .... Two Tablets By Mouth At  Bedtime 11)  Flector 1.3 % Ptch (Diclofenac Epolamine) .... Once Daily 12)  Vitamin E 600 Unit  Caps (Vitamin E) .... One Capsule By Mouth Once Daily 13)  Nasonex 50 Mcg/act Susp (Mometasone Furoate) .... 2 Sprays Each Nostril Once Daily 14)  Voltaren 1 % Gel (Diclofenac Sodium) .... As Directed 15)  Melatonin 5 Mg Tabs (Melatonin) .... One Tablet By Mouth At Bedtime 16)  Nucynta 50 Mg Tabs (Tapentadol Hcl) .... One Tablet By Mouth Three Times A Day 17)  Nuvigil 250 Mg Tabs (Armodafinil) .... As Needed Once Daily 18)  Prednisone(Dosage Unknown) .... As Directed  Allergies (verified): 1)  ! Neurontin (Gabapentin) 2)  ! Nsaids 3)  ! Asa 4)  ! 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 No FH of Colon Cancer: Family History of Pancreatic Cancer: Paternal Grandfather Family History of Diabetes: Father, Maternal Grandmother Family History of Heart Disease: Father  Social History: Reviewed history from 06/14/2008 and no changes required. Occupation: Unemployeed Patient is a former smoker. -stopped  2004 Alcohol Use - no Illicit Drug Use - no Patient does not get regular exercise.   Review of Systems       The pertinent positives and negatives  are noted as above and in the HPI. All other ROS were reviewed and were negative.    Vital Signs:  Patient profile:   40 year old female Height:      60 inches Weight:      112 pounds BMI:     21.95 BSA:     1.46 Pulse rate:   72 / minute Pulse rhythm:   regular BP sitting:   98 / 60  (left arm) Cuff size:   regular  Vitals Entered By: Ok Anis CMA (February 10, 2010 11:19 AM)  Physical Exam  General:  Well developed, well nourished, no acute distress. Head:  Normocephalic and atraumatic. Eyes:  PERRLA, no icterus. Ears:  Normal auditory acuity. Mouth:  No deformity or lesions, dentition normal. Neck:  Supple; no masses or thyromegaly. Lungs:  Clear throughout to auscultation. Heart:  Regular rate and rhythm; no murmurs, rubs,  or bruits. Abdomen:  Soft, nontender and nondistended. No masses, hepatosplenomegaly or hernias noted. Normal bowel sounds. Msk:  Symmetrical with no gross deformities. Normal posture. Pulses:  Normal pulses noted. Extremities:  No clubbing, cyanosis, edema or deformities noted. Neurologic:  Alert and  oriented x4;  grossly normal neurologically. Skin:  Intact without significant lesions or rashes. Axillary Nodes:  No significant axillary adenopathy. Psych:  Alert and cooperative. Normal mood and affect.  Impression & Recommendations:  Problem # 1:  GERD (ICD-530.81) Frequent breakthrough symptoms. Increase omeprazole to 20 mg twice daily and intensify all antireflux measures. Consider a change in proton pump inhibitors if her symptoms have not come under adequate control.  Problem # 2:  ANEMIA-IRON DEFICIENCY (ICD-280.9) Iron deficiency without anemia. Monitoring by her primary physician. She may have poor iron absorption following gastric bypass surgery. No need to repeat colonoscopy and endoscopy at this point, unless she develops additional GI symptoms or she develops anemia and FOBT + stool.  Problem # 3:  FATTY LIVER DISEASE  (ICD-571.8) History of fatty liver disease. LFTs from August 2010 were normal. I suspect this problem has improved substantially with weight loss.   Problem # 4:  BARRETT'S ESOPHAGUS (ICD-530.85) Surveillance endoscopy recommended in August 2012.  Patient Instructions: 1)  Your prescription has been sent to your pharmacy.  2)  Avoid foods high in acid content ( tomatoes, citrus juices, spicy foods) . Avoid eating within 3 to 4 hours of lying down or before exercising. Do not over eat; try smaller more frequent meals. Elevate head of bed four inches when sleeping.  3)  Copy sent to : Loreen Freud, DO 4)  The medication list was reviewed and reconciled.  All changed / newly prescribed medications were explained.  A complete medication list was provided to the patient / caregiver.  Prescriptions: OMEPRAZOLE 20 MG  CPDR (OMEPRAZOLE) one tablet by mouth two times a day  #60 x 11   Entered by:   Christie Nottingham CMA (AAMA)   Authorized by:   Meryl Dare MD [facility]   Signed by:   Meryl Dare MD [facility] on 02/10/2010   Method used:   [facility]  [address] [phone]       [address]       [place]  [place], [place]  [address]       Ph: [phone] or [phone]       Fax: [phone]   RxID:   [id number]

## 2010-11-25 NOTE — Letter (Signed)
Summary: [facility]  [facility]   Imported By: Lanelle Bal 03/10/2010 12:39:49  _____________________________________________________________________  External Attachment:    Type:   Image     Comment:   External Document

## 2010-11-25 NOTE — Progress Notes (Signed)
Summary: still no better  Phone Note Call from Patient Call back at Work Phone [phone]   Caller: Patient Summary of Call: Pt seen on 07-29-10 for issue and states that mucous is now thick and dark yellowish green. pt states that she was told to call if it changed in color to have antibiotics called in.[facility]. pls advise...............Marland KitchenFelecia Deloach CMA  August 11, 2010 9:48 AM   Follow-up for Phone Call        augmentin 875 two times a day for 10 days  con't nasal sprays etc Follow-up by: Loreen Freud DO,  August 11, 2010 10:41 AM    New/Updated Medications: AUGMENTIN 875-125 MG TABS (AMOXICILLIN-POT CLAVULANATE) 1 by mouth two times a day x10 days Prescriptions: AUGMENTIN 875-125 MG TABS (AMOXICILLIN-POT CLAVULANATE) 1 by mouth two times a day x10 days  #20 x 0   Entered by:   Almeta Monas CMA (AAMA)   Authorized by:   Loreen Freud DO   Signed by:   Almeta Monas CMA (AAMA) on 08/11/2010   Method used:   [facility] to ...       [facility]  [address] [phone][facility]ail[facility]       [address]       [place], [place]  [address]       Ph: [phone] or [phone]       Fax: [phone]   RxID:   [id number]

## 2010-11-25 NOTE — Letter (Signed)
Summary: [facility]  [facility]   Imported By: Lanelle Bal 09/19/2010 15:12:45  _____________________________________________________________________  External Attachment:    Type:   Image     Comment:   External Document

## 2010-11-25 NOTE — Assessment & Plan Note (Signed)
Summary: right side bruised/cbs   Vital Signs:  Patient profile:   40 year old female Weight:      106 pounds BMI:     20.78 O2 Sat:      95 % on Room air Pulse rate:   104 / minute BP sitting:   100 / 60  (right arm)  Vitals Entered By: Doristine Devoid CMA (September 26, 2010 1:19 PM)  O2 Flow:  Room air CC: R side bruised after fall on tues. hurts to take deep breath    History of Present Illness: 40 yo woman here today w/ R sided bruising after a fall 3 days ago.  slipped and fell forward against the washer, catching her right below her ribs.  'i felt them cave in'.  pain has been progressing.  having difficulty taking a deep breath.  on Skelaxin, Tramadol, Nucynta (pain management).  Current Medications (verified): 1)  Ultram Er 100 Mg Xr24h-Tab (Tramadol Hcl) .... One Tablet By Mouth Three Times A Day 2)  Valtrex 1 Gm  Tabs (Valacyclovir Hcl) .Marland Kitchen.. 1 By Mouth Once Daily 3)  Skelaxin 800 Mg  Tabs (Metaxalone) .... One Tab By Mouth Two Times A Day As Needed 4)  Temazepam 7.5 Mg  Caps (Temazepam) .... Take 2 Tablets By Mouth At Bedtime 5)  Vitamin D 1000 Unit  Caps (Cholecalciferol) .... One Cap Two Times A Day 6)  Fish Oil 1200 Mg Caps (Omega-3 Fatty Acids) .... One Capsule By Mouth Two Times A Day 7)  Omeprazole 20 Mg  Cpdr (Omeprazole) .... One Tablet By Mouth Two Times A Day 8)  Elmiron 100 Mg Caps (Pentosan Polysulfate Sodium) .... 200mg  Two Times A Day 9)  Tums Calcium For Life Bone 750 Mg Chew (Calcium Carbonate Antacid) .... Two Tablets By Mouth At Bedtime 10)  Flector 1.3 % Ptch (Diclofenac Epolamine) .... Once Daily 11)  Vitamin E 600 Unit  Caps (Vitamin E) .... One Capsule By Mouth Once Daily 12)  Voltaren 1 % Gel (Diclofenac Sodium) .... As Directed 13)  Melatonin 5 Mg Tabs (Melatonin) .... One Tablet By Mouth At Bedtime 14)  Nucynta 50 Mg Tabs (Tapentadol Hcl) .... One Tablet By Mouth Three Times A Day 15)  Nuvigil 250 Mg Tabs (Armodafinil) .... As Needed Once  Daily 16)  Vesicare 5 Mg Tabs (Solifenacin Succinate) .... By Mouth Daily 17)  Amitriptyline Hcl .... Pt Usure of Dose 18)  Testosterone .... Injections in Office 19)  Flonase 50 Mcg/act Susp (Fluticasone Propionate) .... 2  Sprays Each Nostril Once Daily 20)  Allegra Allergy 180 Mg Tabs (Fexofenadine Hcl) .Marland Kitchen.. 1 By Mouth Once Daily 21)  Augmentin 875-125 Mg Tabs (Amoxicillin-Pot Clavulanate) .Marland Kitchen.. 1 By Mouth Two Times A Day X10 Days  Allergies (verified): 1)  ! Neurontin (Gabapentin) 2)  ! Nsaids 3)  ! Asa 4)  ! Cipro  Review of Systems      See HPI  Physical Exam  General:  obviously uncomfortable Chest Wall:  + TTP along lower ribs bilaterally, particularly at costosternal angle.  no obvious bony abnormality Lungs:  Normal respiratory effort, chest expands symmetrically. Lungs are clear to auscultation, no crackles or wheezes. Heart:  normal rate and no murmur.     Impression & Recommendations:  Problem # 1:  RIB PAIN (ICD-786.50) Assessment New no obvious bony deformity but will get xrays to assess for fx.  pt under contract w/ pain management-encouraged her to discuss acute pain w/ them so they can adjust  meds.  Pt expresses understanding and is in agreement w/ this plan. Orders: T-Ribs Bilateral 4 Views [id number])  Complete Medication List: 1)  Ultram Er 100 Mg Xr24h-tab (Tramadol hcl) .... One tablet by mouth three times a day 2)  Valtrex 1 Gm Tabs (Valacyclovir hcl) .Marland Kitchen.. 1 by mouth once daily 3)  Skelaxin 800 Mg Tabs (Metaxalone) .... One tab by mouth two times a day as needed 4)  Temazepam 7.5 Mg Caps (Temazepam) .... Take 2 tablets by mouth at bedtime 5)  Vitamin D 1000 Unit Caps (Cholecalciferol) .... One cap two times a day 6)  Fish Oil 1200 Mg Caps (Omega-3 fatty acids) .... One capsule by mouth two times a day 7)  Omeprazole 20 Mg Cpdr (Omeprazole) .... One tablet by mouth two times a day 8)  Elmiron 100 Mg Caps (Pentosan polysulfate sodium) .... 200mg  two times a  day 9)  Tums Calcium For Life Bone 750 Mg Chew (Calcium carbonate antacid) .... Two tablets by mouth at bedtime 10)  Flector 1.3 % Ptch (Diclofenac epolamine) .... Once daily 11)  Vitamin E 600 Unit Caps (Vitamin e) .... One capsule by mouth once daily 12)  Voltaren 1 % Gel (Diclofenac sodium) .... As directed 13)  Melatonin 5 Mg Tabs (Melatonin) .... One tablet by mouth at bedtime 14)  Nucynta 50 Mg Tabs (Tapentadol hcl) .... One tablet by mouth three times a day 15)  Nuvigil 250 Mg Tabs (Armodafinil) .... As needed once daily 16)  Vesicare 5 Mg Tabs (Solifenacin succinate) .... By mouth daily 17)  Amitriptyline Hcl  .... Pt usure of dose 18)  Testosterone  .... Injections in office 19)  Flonase 50 Mcg/act Susp (Fluticasone propionate) .... 2  sprays each nostril once daily 20)  Allegra Allergy 180 Mg Tabs (Fexofenadine hcl) .Marland Kitchen.. 1 by mouth once daily 21)  Augmentin 875-125 Mg Tabs (Amoxicillin-pot clavulanate) .Marland Kitchen.. 1 by mouth two times a day x10 days  Patient Instructions: 1)  Please go to the [facility] on [address] to get your xray- we'll call you with your lab results 2)  Heat your ribs for pain relief 3)  Call your pain management center to discuss increasing or changing meds 4)  REST! 5)  Call with any questions or concerns 6)  Hang in there!!!   Orders Added: 1)  T-Ribs Bilateral 4 Views [71111TC] 2)  Est. Patient Level III [45409]

## 2010-11-25 NOTE — Letter (Signed)
Summary: [facility]  [facility]   Imported By: Lanelle Bal 05/09/2010 11:24:33  _____________________________________________________________________  External Attachment:    Type:   Image     Comment:   External Document

## 2010-11-25 NOTE — Assessment & Plan Note (Signed)
Summary: NUMBNESS/IN HANDS AND FEET/TOES/KDC   Vital Signs:  Patient profile:   40 year old female Weight:      111.38 pounds Temp:     97.7 degrees F oral Pulse rate:   68 / minute Pulse rhythm:   regular BP sitting:   102 / 68  (left arm) Cuff size:   regular  Vitals Entered By: Army Fossa CMA (January 28, 2010 10:43 AM) CC: Pt c/o numbness in tips of fingers and tips of toes.   History of Present Illness: Pt here c/o numbness in fingers and toes when cold and they turn white.  Pt also c/o L ear feeling full and wanting to pop all the time.   No otc meds.  Current Medications (verified): 1)  Ultram 50 Mg  Tabs (Tramadol Hcl) .... Take Two Tablet Bid Times A Day 2)  Valtrex 1 Gm  Tabs (Valacyclovir Hcl) .Marland Kitchen.. 1 By Mouth Once Daily 3)  Skelaxin 800 Mg  Tabs (Metaxalone) .... One Tab By Mouth Two Times A Day As Needed 4)  Temazepam 7.5 Mg  Caps (Temazepam) .... Take 2 Tablets By Mouth At Bedtime 5)  Vitamin D 1000 Unit  Caps (Cholecalciferol) .... One Cap Two Times A Day 6)  Fish Oil Capsules (Fish Oil) 2400 Units .... Take 1 Capsule By Mouth Once Daily 7)  Omeprazole 20 Mg  Cpdr (Omeprazole) .... One Capsule By Mouth Every Morning 8)  Boric Acid Capsulea .... Once Week Vagina 9)  Elmiron 200mg  .... Two Times A Day 10)  Tums 11)  Flector 1.3 % Ptch (Diclofenac Epolamine) 12)  Vitamin E 13)  Clarinex-D 12 Hour 2.5-120 Mg Xr12h-Tab (Desloratadine-Pseudoephedrine) .Marland Kitchen.. 1 By Mouth Two Times A Day For 7-10 Days 14)  Nasonex 50 Mcg/act Susp (Mometasone Furoate) .... 2 Sprays Each Nostril Once Daily  Allergies: 1)  ! Neurontin (Gabapentin) 2)  ! Nsaids 3)  ! Asa 4)  ! Cipro  Family History: Reviewed history from 06/14/2008 and no changes required. No FH of Colon Cancer: Family History of Pancreatic Cancer: Paternal Grandfather Family History of Diabetes: Father, Maternal Grandmother Family History of Heart Disease: Father  Social History: Reviewed history from 06/14/2008  and no changes required. Occupation: Unemployeed Patient is a former smoker. -stopped 2004 Alcohol Use - no Illicit Drug Use - no Patient does not get regular exercise.   Review of Systems      See HPI  Physical Exam  General:  Well-developed,well-nourished,in no acute distress; alert,appropriate and cooperative throughout examination Ears:  External ear exam shows no significant lesions or deformities.  Otoscopic examination reveals clear canals, tympanic membranes are intact bilaterally without bulging, retraction, inflammation or discharge. Hearing is grossly normal bilaterally. Nose:  External nasal examination shows no deformity or inflammation. Nasal mucosa are pink and moist without lesions or exudates. Mouth:  Oral mucosa and oropharynx without lesions or exudates.  Teeth in good repair. Extremities:  No clubbing, cyanosis, edema, or deformity noted with normal full range of motion of all joints.   good capillary refill Neurologic:  alert & oriented X3 and gait normal.   Psych:  Oriented X3 and normally interactive.     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) check labs  keep hands and feet warm f/u Dr Corliss Skains if no better Orders: Venipuncture (19147) TLB-B12 + Folate Pnl ([id number]/[id number]) TLB-IBC Pnl (Iron/FE;Transferrin) (83550-IBC) TLB-CBC Platelet - w/Differential (85025-CBCD) TLB-BMP (Basic Metabolic Panel-BMET) (80048-METABOL) TLB-Ferritin (82728-FER) TLB-Rheumatoid Factor (RA) (96295-MW) TLB-Sedimentation Rate (  ESR) (85652-ESR) T-Antinuclear Antib (ANA) [phone])  Problem # 2:  OTITIS MEDIA, SEROUS, LEFT (ICD-381.4) clarinex d 12 h for 7- 10 days  nasonex call or rto prn  Complete Medication List: 1)  Ultram 50 Mg Tabs (Tramadol hcl) .... Take two tablet bid times a day 2)  Valtrex 1 Gm Tabs (Valacyclovir hcl) .Marland Kitchen.. 1 by mouth once daily 3)  Skelaxin 800 Mg Tabs (Metaxalone) .... One tab by mouth two times a day  as needed 4)  Temazepam 7.5 Mg Caps (Temazepam) .... Take 2 tablets by mouth at bedtime 5)  Vitamin D 1000 Unit Caps (Cholecalciferol) .... One cap two times a day 6)  Fish Oil Capsules (fish Oil) 2400 Units  .... Take 1 capsule by mouth once daily 7)  Omeprazole 20 Mg Cpdr (Omeprazole) .... One capsule by mouth every morning 8)  Boric Acid Capsulea  .... Once week vagina 9)  Elmiron 200mg   .... Two times a day 10)  Tums  11)  Flector 1.3 % Ptch (Diclofenac epolamine) 12)  Vitamin E  13)  Clarinex-d 12 Hour 2.5-120 Mg Xr12h-tab (Desloratadine-pseudoephedrine) .Marland Kitchen.. 1 by mouth two times a day for 7-10 days 14)  Nasonex 50 Mcg/act Susp (Mometasone furoate) .... 2 sprays each nostril once daily

## 2010-11-27 NOTE — Letter (Signed)
Summary: [facility]  [facility]   Imported By: Lanelle Bal 10/10/2010 13:41:10  _____________________________________________________________________  External Attachment:    Type:   Image     Comment:   External Document

## 2010-12-17 ENCOUNTER — Ambulatory Visit (INDEPENDENT_AMBULATORY_CARE_PROVIDER_SITE_OTHER): Payer: Managed Care, Other (non HMO) | Admitting: Internal Medicine

## 2010-12-17 ENCOUNTER — Encounter: Payer: Self-pay | Admitting: Internal Medicine

## 2010-12-17 ENCOUNTER — Other Ambulatory Visit: Payer: Self-pay | Admitting: Internal Medicine

## 2010-12-17 ENCOUNTER — Ambulatory Visit (HOSPITAL_BASED_OUTPATIENT_CLINIC_OR_DEPARTMENT_OTHER)
Admission: RE | Admit: 2010-12-17 | Discharge: 2010-12-17 | Disposition: A | Discharge status: Home / Self Care | Payer: Managed Care, Other (non HMO) | Source: Ambulatory Visit | Attending: Internal Medicine | Admitting: Internal Medicine

## 2010-12-17 DIAGNOSIS — S9030XA Contusion of unspecified foot, initial encounter: Secondary | ICD-10-CM | POA: Insufficient documentation

## 2010-12-17 DIAGNOSIS — X58XXXA Exposure to other specified factors, initial encounter: Secondary | ICD-10-CM | POA: Insufficient documentation

## 2010-12-17 DIAGNOSIS — T148XXA Other injury of unspecified body region, initial encounter: Secondary | ICD-10-CM

## 2010-12-23 NOTE — Assessment & Plan Note (Signed)
Summary: ? LEFT BROKEN TOE/RH....   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 One Tablet By Mouth Three Times A Day 2)  Valtrex 1 Gm  Tabs (Valacyclovir Hcl) .Marland Kitchen.. 1 By Mouth Once Daily 3)  Skelaxin 800 Mg  Tabs (Metaxalone) .... One Tab By Mouth Two Times A Day As Needed 4)  Temazepam 7.5 Mg  Caps (Temazepam) .... Take 2 Tablets By Mouth At Bedtime 5)  Vitamin D 1000 Unit  Caps (Cholecalciferol) .... One Cap Two Times A Day 6)  Fish Oil 1200 Mg Caps (Omega-3 Fatty Acids) .... One Capsule By Mouth Two Times A Day 7)  Omeprazole 20 Mg  Cpdr (Omeprazole) .... One Tablet By Mouth Two Times A Day 8)  Elmiron 100 Mg Caps (Pentosan Polysulfate Sodium) .... 200mg  Two Times A Day 9)  Tums Calcium For Life Bone 750 Mg Chew (Calcium Carbonate Antacid) .... Two Tablets By Mouth At Bedtime 10)  Flector 1.3 % Ptch (Diclofenac Epolamine) .... Once Daily 11)  Vitamin E 600 Unit  Caps (Vitamin E) .... One Capsule By Mouth Once Daily 12)  Voltaren 1 % Gel (Diclofenac Sodium) .... As Directed 13)  Melatonin 5 Mg Tabs (Melatonin) .... One Tablet By Mouth At Bedtime 14)  Nucynta 50 Mg Tabs (Tapentadol Hcl) .... One Tablet By Mouth Three Times A Day 15)  Nuvigil 250 Mg Tabs (Armodafinil) .... As Needed Once Daily 16)  Vesicare 5 Mg Tabs (Solifenacin Succinate) .... By  Mouth Daily 17)  Amitriptyline Hcl .... Pt Usure of Dose 18)  Flonase 50 Mcg/act Susp (Fluticasone Propionate) .... 2  Sprays Each Nostril Once Daily 19)  Allegra Allergy 180 Mg Tabs (Fexofenadine Hcl) .Marland Kitchen.. 1 By Mouth Once Daily  Allergies (verified): 1)  ! Neurontin (Gabapentin) 2)  ! Nsaids 3)  ! Asa 4)  ! Cipro  Past History:  Past Medical History: Reviewed history from 02/10/2010 and no changes required. 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   Extremities:  R foot normal L foot normal except for the 5th toe w/ is slightly  swollen , + tender, no  warm, no open wounds , no deformities    Impression & Recommendations:  Problem # 1:  CONTUSION, LEFT  FOOT (ICD-924.20) we taped together the 5th and 4th L toes provided a post op-shoe elevate leg XR  Orders: T-Foot Left Min 3 Views (73630TC)  Complete Medication List: 1)  Ultram Er 100 Mg Xr24h-tab (Tramadol hcl) .... One tablet by mouth three times a day 2)  Valtrex 1 Gm Tabs (Valacyclovir hcl) .Marland Kitchen.. 1 by mouth once daily 3)  Skelaxin 800 Mg Tabs (Metaxalone) .... One tab by mouth two times a day as needed 4)  Temazepam 7.5 Mg Caps (Temazepam) .... Take 2 tablets by mouth at bedtime 5)   Vitamin D 1000 Unit Caps (Cholecalciferol) .... One cap two times a day 6)  Fish Oil 1200 Mg Caps (Omega-3 fatty acids) .... One capsule by mouth two times a day 7)  Omeprazole 20 Mg Cpdr (Omeprazole) .... One tablet by mouth two times a day 8)  Elmiron 100 Mg Caps (Pentosan polysulfate sodium) .... 200mg  two times a day 9)  Tums Calcium For Life Bone 750 Mg Chew (Calcium carbonate antacid) .... Two tablets by mouth at bedtime 10)  Flector 1.3 % Ptch (Diclofenac epolamine) .... Once daily 11)  Vitamin E 600 Unit Caps (Vitamin e) .... One capsule by mouth once daily 12)  Voltaren 1 % Gel (Diclofenac sodium) .... As directed 13)  Melatonin 5 Mg Tabs (Melatonin) .... One tablet by mouth at bedtime 14)  Nucynta 50 Mg Tabs (Tapentadol hcl) .... One tablet by mouth three times a day 15)  Nuvigil 250 Mg Tabs (Armodafinil) .... As needed once daily 16)  Vesicare 5 Mg Tabs (Solifenacin succinate) .... By mouth daily 17)  Amitriptyline Hcl  .... Pt usure of dose 18)  Flonase 50 Mcg/act Susp (Fluticasone propionate) .... 2  sprays each nostril once daily 19)  Allegra Allergy 180 Mg Tabs (Fexofenadine hcl) .Marland Kitchen.. 1 by mouth once daily   Orders Added: 1)  T-Foot Left Min 3 Views [73630TC] 2)  Est. Patient Level III [16109]

## 2011-01-12 ENCOUNTER — Telehealth (INDEPENDENT_AMBULATORY_CARE_PROVIDER_SITE_OTHER): Payer: Self-pay | Admitting: *Deleted

## 2011-01-12 ENCOUNTER — Ambulatory Visit (INDEPENDENT_AMBULATORY_CARE_PROVIDER_SITE_OTHER): Payer: Managed Care, Other (non HMO) | Admitting: Family Medicine

## 2011-01-12 ENCOUNTER — Encounter: Payer: Self-pay | Admitting: Family Medicine

## 2011-01-12 ENCOUNTER — Other Ambulatory Visit: Payer: Self-pay | Admitting: Family Medicine

## 2011-01-12 DIAGNOSIS — E162 Hypoglycemia, unspecified: Secondary | ICD-10-CM

## 2011-01-12 DIAGNOSIS — R002 Palpitations: Secondary | ICD-10-CM

## 2011-01-12 HISTORY — DX: Hypoglycemia, unspecified: E16.2

## 2011-01-13 ENCOUNTER — Telehealth: Payer: Self-pay | Admitting: *Deleted

## 2011-01-13 LAB — CBC WITH DIFFERENTIAL/PLATELET
Basophils Absolute: 0 10*3/uL (ref 0.0–0.1)
Basophils Relative: 0.1 % (ref 0.0–3.0)
Eosinophils Absolute: 0.4 10*3/uL (ref 0.0–0.7)
HCT: 42 % (ref 36.0–46.0)
Hemoglobin: 14.2 g/dL (ref 12.0–15.0)
Lymphs Abs: 2.3 10*3/uL (ref 0.7–4.0)
MCHC: 33.9 g/dL (ref 30.0–36.0)
Neutro Abs: 2.2 10*3/uL (ref 1.4–7.7)
RDW: 13.4 % (ref 11.5–14.6)

## 2011-01-13 LAB — BASIC METABOLIC PANEL
Calcium: 9.7 mg/dL (ref 8.4–10.5)
GFR: 86.06 mL/min (ref >60.00)
Sodium: 144 mEq/L (ref 135–145)

## 2011-01-13 LAB — HEPATIC FUNCTION PANEL
Alkaline Phosphatase: 56 U/L (ref 39–117)
Bilirubin, Direct: 0 mg/dL (ref 0.0–0.3)
Total Bilirubin: 0.4 mg/dL (ref 0.3–1.2)

## 2011-01-13 NOTE — Telephone Encounter (Signed)
Done in error.

## 2011-01-15 NOTE — Progress Notes (Signed)
Pt aware of results, stated she was having some allergy issues, but no current problem or concerns...Marland KitchenMarland KitchenMarland Kitchen KP

## 2011-01-16 ENCOUNTER — Other Ambulatory Visit (INDEPENDENT_AMBULATORY_CARE_PROVIDER_SITE_OTHER): Payer: Managed Care, Other (non HMO)

## 2011-01-16 DIAGNOSIS — E162 Hypoglycemia, unspecified: Secondary | ICD-10-CM

## 2011-01-16 LAB — GLUCOSE TOLERANCE, 3 HOURS
Glucose, 1 Hour GTT: 250 mg/dL
Glucose, 2 hour: 159 mg/dL

## 2011-01-22 NOTE — Assessment & Plan Note (Signed)
Summary: dizzy,shaky/cbs   Vital Signs:  Patient profile:   40 year old female Weight:      105.2 pounds Temp:     98.7 degrees F oral BP sitting:   112 / 64  (left arm) Cuff size:   regular  Vitals Entered By: Almeta Monas CMA Duncan Dull) (January 12, 2011 1:53 PM) CC: concerned about possible low blood sugar--frequent episodes of tremors, weakness, nausea ,lightheadedness and shakes   History of Present Illness: Pt here c/o dizziness, lightheadedness and shakes and palpatations.  These have been occurring for the last 6-8 months.  Early on food helped but now it takes longer to get over.     Problems Prior to Update: 1)  Palpitations, Occasional  (ICD-785.1) 2)  Hypoglycemia  (ICD-251.2) 3)  Contusion, Left Foot  (ICD-924.20) 4)  Rib Pain  (ICD-786.50) 5)  Allergic Rhinitis Cause Unspecified  (ICD-477.9) 6)  Barrett's Esophagus  (ICD-530.85) 7)  Anemia-iron Deficiency  (ICD-280.9) 8)  Gerd  (ICD-530.81) 9)  Otitis Media, Serous, Left  (ICD-381.4) 10)  Raynauds Syndrome  (ICD-443.0) 11)  Diarrhea  (ICD-787.91) 12)  Interstitial Cystitis  (ICD-595.1) 13)  Paresthesia  (ICD-782.0) 14)  Headache  (ICD-784.0) 15)  Gastritis  (ICD-535.50) 16)  Hiatal Hernia  (ICD-553.3) 17)  Fatty Liver Disease  (ICD-571.8) 18)  Abdominal Pain, Periumbilic  (ICD-789.05) 19)  Coccygeal Pain  (ICD-724.79) 20)  Constipation  (ICD-564.00) 21)  Hepatomegaly, Hx of  (ICD-V12.79) 22)  Uti  (ICD-599.0) 23)  Hyperlipidemia  (ICD-272.4) 24)  Goiter, Unspecified  (ICD-240.9) 25)  Malaise and Fatigue  (ICD-780.79) 26)  Heat Rash  (ICD-705.1) 27)  Infection, Urinary Tract Nos  (ICD-599.0) 28)  Irregular Menses  (ICD-626.4) 29)  Polycystic Ovaries  (ICD-256.4) 30)  Gerd  (ICD-530.81) 31)  Pstprc Status, Bariatric Surgery  (ICD-V45.86) 32)  Fibromyalgia  (ICD-729.1) 33)  Obstructive Sleep Apnea  (ICD-327.23)  Medications Prior to Update: 1)  Ultram Er 100 Mg Xr24h-Tab (Tramadol Hcl) .... One Tablet By  Mouth Three Times A Day 2)  Valtrex 1 Gm  Tabs (Valacyclovir Hcl) .Marland Kitchen.. 1 By Mouth Once Daily 3)  Skelaxin 800 Mg  Tabs (Metaxalone) .... One Tab By Mouth Two Times A Day As Needed 4)  Temazepam 7.5 Mg  Caps (Temazepam) .... Take 2 Tablets By Mouth At Bedtime 5)  Vitamin D 1000 Unit  Caps (Cholecalciferol) .... One Cap Two Times A Day 6)  Fish Oil 1200 Mg Caps (Omega-3 Fatty Acids) .... One Capsule By Mouth Two Times A Day 7)  Omeprazole 20 Mg  Cpdr (Omeprazole) .... One Tablet By Mouth Two Times A Day 8)  Elmiron 100 Mg Caps (Pentosan Polysulfate Sodium) .... 200mg  Two Times A Day 9)  Tums Calcium For Life Bone 750 Mg Chew (Calcium Carbonate Antacid) .... Two Tablets By Mouth At Bedtime 10)  Flector 1.3 % Ptch (Diclofenac Epolamine) .... Once Daily 11)  Vitamin E 600 Unit  Caps (Vitamin E) .... One Capsule By Mouth Once Daily 12)  Voltaren 1 % Gel (Diclofenac Sodium) .... As Directed 13)  Melatonin 5 Mg Tabs (Melatonin) .... One Tablet By Mouth At Bedtime 14)  Nucynta 50 Mg Tabs (Tapentadol Hcl) .... One Tablet By Mouth Three Times A Day 15)  Nuvigil 250 Mg Tabs (Armodafinil) .... As Needed Once Daily 16)  Vesicare 5 Mg Tabs (Solifenacin Succinate) .... By Mouth Daily 17)  Amitriptyline Hcl .... Pt Usure of Dose 18)  Flonase 50 Mcg/act Susp (Fluticasone Propionate) .... 2  Sprays Each Nostril  Once Daily 19)  Allegra Allergy 180 Mg Tabs (Fexofenadine Hcl) .Marland Kitchen.. 1 By Mouth Once Daily  Current Medications (verified): 1)  Ultram Er 100 Mg Xr24h-Tab (Tramadol Hcl) .... One Tablet By Mouth Three Times A Day 2)  Valtrex 1 Gm  Tabs (Valacyclovir Hcl) .Marland Kitchen.. 1 By Mouth Once Daily 3)  Skelaxin 800 Mg  Tabs (Metaxalone) .... One Tab By Mouth Two Times A Day As Needed 4)  Temazepam 7.5 Mg  Caps (Temazepam) .... Take 2 Tablets By Mouth At Bedtime 5)  Vitamin D 1000 Unit  Caps (Cholecalciferol) .... One Cap Two Times A Day 6)  Fish Oil 1200 Mg Caps (Omega-3 Fatty Acids) .... One Capsule By Mouth Two Times A  Day 7)  Omeprazole 20 Mg  Cpdr (Omeprazole) .... One Tablet By Mouth Two Times A Day 8)  Elmiron 100 Mg Caps (Pentosan Polysulfate Sodium) .... 200mg  Two Times A Day 9)  Tums Calcium For Life Bone 750 Mg Chew (Calcium Carbonate Antacid) .... Two Tablets By Mouth At Bedtime 10)  Flector 1.3 % Ptch (Diclofenac Epolamine) .... Once Daily 11)  Vitamin E 600 Unit  Caps (Vitamin E) .... One Capsule By Mouth Once Daily 12)  Voltaren 1 % Gel (Diclofenac Sodium) .... As Directed 13)  Melatonin 5 Mg Tabs (Melatonin) .... One Tablet By Mouth At Bedtime 14)  Nucynta 75 Mg Tabs (Tapentadol Hcl) .... 2 By Mouth Three Times A Day 15)  Nuvigil 250 Mg Tabs (Armodafinil) .... As Needed Once Daily 16)  Vesicare 5 Mg Tabs (Solifenacin Succinate) .... By Mouth Daily 17)  Amitriptyline Hcl .... Pt Usure of Dose 18)  Flonase 50 Mcg/act Susp (Fluticasone Propionate) .... 2  Sprays Each Nostril Once Daily 19)  Allegra Allergy 180 Mg Tabs (Fexofenadine Hcl) .Marland Kitchen.. 1 By Mouth Once Daily  Allergies (verified): 1)  ! Neurontin (Gabapentin) 2)  ! Nsaids 3)  ! Asa 4)  ! Cipro  Past History:  Past Medical History: Last updated: 02/10/2010 UTI (ICD-599.0) HYPERLIPIDEMIA (ICD-272.4) GOITER, UNSPECIFIED (ICD-240.9) IRREGULAR MENSES (ICD-626.4) POLYCYSTIC OVARIES (ICD-256.4) GERD (ICD-530.81) BARRETTS ESOPHAGUS PSTPRC STATUS, BARIATRIC SURGERY (ICD-V45.86) FIBROMYALGIA (ICD-729.1) OBSTRUCTIVE SLEEP APNEA  GASTRITIS (ICD-535.50) HIATAL HERNIA (ICD-553.3) FATTY LIVER DISEASE (ICD-571.8) CONSTIPATION (ICD-564.00) HEPATOMEGALY, HX OF (ICD-V12.79)  Past Surgical History: Last updated: 02/10/2010 Caesarean section 1994 Roux-en-Y gastric bypass with takedown of Nissen fundoplication 03/2006 Umbilical hernia repair 03/2006 Nissen fundoplication Hysterectomy (02/18/2009)- TAH BSO-prolapse Bladder Sling--Dr MacDermot4/26/2010  Family History: Last updated: 06/14/2008 No FH of Colon Cancer: Family History of  Pancreatic Cancer: Paternal Grandfather Family History of Diabetes: Father, Maternal Grandmother Family History of Heart Disease: Father  Social History: Last updated: 06/14/2008 Occupation: Unemployeed Patient is a former smoker. -stopped 2004 Alcohol Use - no Illicit Drug Use - no Patient does not get regular exercise.   Risk Factors: Exercise: no (06/14/2008)  Risk Factors: Smoking Status: quit (06/14/2008)  Family History: Reviewed history from 06/14/2008 and no changes required. No FH of Colon Cancer: Family History of Pancreatic Cancer: Paternal Grandfather Family History of Diabetes: Father, Maternal Grandmother Family History of Heart Disease: Father  Social History: Reviewed history from 06/14/2008 and no changes required. Occupation: Unemployeed Patient is a former smoker. -stopped 2004 Alcohol Use - no Illicit Drug Use - no Patient does not get regular exercise.   Review of Systems      See HPI  Physical Exam  General:  Well-developed,well-nourished,in no acute distress; alert,appropriate and cooperative throughout examination Neck:  No deformities, masses, or tenderness noted. Lungs:  Normal  respiratory effort, chest expands symmetrically. Lungs are clear to auscultation, no crackles or wheezes. Heart:  Normal rate and regular rhythm. S1 and S2 normal without gallop, murmur, click, rub or other extra sounds. Msk:  No deformity or scoliosis noted of thoracic or lumbar spine.   Pulses:  R and L carotid,radial,femoral,dorsalis pedis and posterior tibial pulses are full and equal bilaterally Extremities:  No clubbing, cyanosis, edema, or deformity noted with normal full range of motion of all joints.   Neurologic:  No cranial nerve deficits noted. Station and gait are normal. Plantar reflexes are down-going bilaterally. DTRs are symmetrical throughout. Sensory, motor and coordinative functions appear intact. Skin:  Intact without suspicious lesions or  rashes Cervical Nodes:  No lymphadenopathy noted Axillary Nodes:  No palpable lymphadenopathy Psych:  Cognition and judgment appear intact. Alert and cooperative with normal attention span and concentration. No apparent delusions, illusions, hallucinations   Impression & Recommendations:  Problem # 1:  PALPITATIONS, OCCASIONAL (ICD-785.1)  Orders: Venipuncture (16109) TLB-BMP (Basic Metabolic Panel-BMET) (80048-METABOL) TLB-Hepatic/Liver Function Pnl (80076-HEPATIC) TLB-CBC Platelet - w/Differential (85025-CBCD) TLB-TSH (Thyroid Stimulating Hormone) (84443-TSH) TLB-Glucose Tol (3 HR) (82951-3GTTP)  Avoid caffeine, chocolate, and stimulants. Stress reduction as well as medication options discussed.   Problem # 2:  HYPOGLYCEMIA (ICD-251.2)  Orders: T- * Misc. Laboratory test [phone]) Venipuncture [phone]) TLB-BMP (Basic Metabolic Panel-BMET) (80048-METABOL) TLB-Hepatic/Liver Function Pnl (80076-HEPATIC) TLB-CBC Platelet - w/Differential (85025-CBCD) TLB-TSH (Thyroid Stimulating Hormone) (84443-TSH) TLB-Glucose Tol (3 HR) (82951-3GTTP)  Complete Medication List: 1)  Ultram Er 100 Mg Xr24h-tab (Tramadol hcl) .... One tablet by mouth three times a day 2)  Valtrex 1 Gm Tabs (Valacyclovir hcl) .Marland Kitchen.. 1 by mouth once daily 3)  Skelaxin 800 Mg Tabs (Metaxalone) .... One tab by mouth two times a day as needed 4)  Temazepam 7.5 Mg Caps (Temazepam) .... Take 2 tablets by mouth at bedtime 5)  Vitamin D 1000 Unit Caps (Cholecalciferol) .... One cap two times a day 6)  Fish Oil 1200 Mg Caps (Omega-3 fatty acids) .... One capsule by mouth two times a day 7)  Omeprazole 20 Mg Cpdr (Omeprazole) .... One tablet by mouth two times a day 8)  Elmiron 100 Mg Caps (Pentosan polysulfate sodium) .... 200mg  two times a day 9)  Tums Calcium For Life Bone 750 Mg Chew (Calcium carbonate antacid) .... Two tablets by mouth at bedtime 10)  Flector 1.3 % Ptch (Diclofenac epolamine) .... Once daily 11)  Vitamin E  600 Unit Caps (Vitamin e) .... One capsule by mouth once daily 12)  Voltaren 1 % Gel (Diclofenac sodium) .... As directed 13)  Melatonin 5 Mg Tabs (Melatonin) .... One tablet by mouth at bedtime 14)  Nucynta 75 Mg Tabs (Tapentadol hcl) .... 2 by mouth three times a day 15)  Nuvigil 250 Mg Tabs (Armodafinil) .... As needed once daily 16)  Vesicare 5 Mg Tabs (Solifenacin succinate) .... By mouth daily 17)  Amitriptyline Hcl  .... Pt usure of dose 18)  Flonase 50 Mcg/act Susp (Fluticasone propionate) .... 2  sprays each nostril once daily 19)  Allegra Allergy 180 Mg Tabs (Fexofenadine hcl) .Marland Kitchen.. 1 by mouth once daily   Orders Added: 1)  T- * Misc. Laboratory test [99999] 2)  Venipuncture [36415] 3)  TLB-BMP (Basic Metabolic Panel-BMET) [80048-METABOL] 4)  TLB-Hepatic/Liver Function Pnl [80076-HEPATIC] 5)  TLB-CBC Platelet - w/Differential [85025-CBCD] 6)  TLB-TSH (Thyroid Stimulating Hormone) [84443-TSH] 7)  Est. Patient Level IV [91478] 8)  TLB-Glucose Tol (3 HR) [82951-3GTTP]

## 2011-01-22 NOTE — Progress Notes (Signed)
Summary: 3 hr gtt   ---- Converted from flag ---- ---- 01/12/2011 4:05 PM, Almeta Monas CMA (AAMA) wrote:   ---- 01/12/2011 2:13 PM, Loreen Freud DO wrote: 3 hr GTT needs to be scheduled at [facility]----any day is fine---- [phone] cell ------------------------------  called [facility] patient will have test done 3/23.Marland KitchenMarland KitchenDoristine Devoid CMA  January 12, 2011 4:10 PM

## 2011-02-04 LAB — CBC
Hemoglobin: 11.1 g/dL — ABNORMAL LOW (ref 12.0–15.0)
MCHC: 34.8 g/dL (ref 30.0–36.0)
Platelets: 193 10*3/uL (ref 150–400)
RBC: 3.24 MIL/uL — ABNORMAL LOW (ref 3.87–5.11)
RDW: 12.3 % (ref 11.5–15.5)
WBC: 8.1 10*3/uL (ref 4.0–10.5)

## 2011-02-04 LAB — VITAMIN D 25 HYDROXY (VIT D DEFICIENCY, FRACTURES): Vit D, 25-Hydroxy: 44 ng/mL (ref 30–89)

## 2011-02-04 LAB — PREGNANCY, URINE: Preg Test, Ur: NEGATIVE

## 2011-02-04 LAB — BASIC METABOLIC PANEL
BUN: 8 mg/dL (ref 6–23)
Calcium: 9.5 mg/dL (ref 8.4–10.5)
GFR calc non Af Amer: >60 mL/min (ref >60)
Glucose, Bld: 100 mg/dL — ABNORMAL HIGH (ref 70–99)
Sodium: 137 mEq/L (ref 135–145)

## 2011-02-09 ENCOUNTER — Encounter: Payer: Self-pay | Admitting: Endocrinology

## 2011-02-09 ENCOUNTER — Ambulatory Visit (INDEPENDENT_AMBULATORY_CARE_PROVIDER_SITE_OTHER): Payer: Managed Care, Other (non HMO) | Admitting: Endocrinology

## 2011-02-09 VITALS — BP 108/74 | HR 101 | Temp 98.7°F | Ht 65.0 in | Wt 102.8 lb

## 2011-02-09 DIAGNOSIS — E162 Hypoglycemia, unspecified: Secondary | ICD-10-CM

## 2011-02-09 LAB — CBC
Hemoglobin: 14.7 g/dL (ref 12.0–15.0)
MCHC: 34 g/dL (ref 30.0–36.0)
MCV: 97 fL (ref 78.0–100.0)
RBC: 4.46 MIL/uL (ref 3.87–5.11)
WBC: 8.8 10*3/uL (ref 4.0–10.5)

## 2011-02-09 LAB — HCG, SERUM, QUALITATIVE: Preg, Serum: NEGATIVE

## 2011-02-09 NOTE — Progress Notes (Signed)
  Subjective:    Patient ID: [name], female    DOB: 01/23/1971, 40 y.o.   MRN: [id number]  HPI Pt had gastric bypass surgery in 2007.  She lost 130 lbs.  She has 5 years of moderate nausea sensation in the abdomen, and assoc fatigue.  She has many years of intermittent hypoglycemia, usually within a few hrs after eating.  She says the hypoglycemia is somewhat worse since the gastric bypass surgery.   Past Medical History  Diagnosis Date  . 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 Goiter, unspecified 09/16/2007  . Polycystic ovaries 11/24/2006  . HYPERLIPIDEMIA 01/11/2008  . ANEMIA-IRON DEFICIENCY 02/10/2010  . OBSTRUCTIVE SLEEP APNEA 11/24/2006  . GERD 11/24/2006  . Barrett's esophagus 02/10/2010  . HIATAL HERNIA 06/26/2008  . FATTY LIVER DISEASE 06/14/2008  . HEPATOMEGALY, HX OF 02/23/2008  . HYPOGLYCEMIA 01/12/2011  . 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 FIBROMYALGIA 11/24/2006  . IRREGULAR MENSES 05/10/2007   Past Surgical History  Procedure Date  . Cesarean section 1994  . Hernia repair 03/2006    Umbilical Hernia repair  . 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 Bladder suspension 02/18/2009  . Abdominal hysterectomy 02/18/2009    TAH BSO-prolaspe  . Nissen fundoplication     reports that she has been smoking Cigarettes.  She has been smoking about .5 packs per day. She does not have any smokeless tobacco history on file. She reports that she does not drink alcohol or use illicit drugs. Married Works Training and development officer. family history includes Cancer in her paternal grandfather; Diabetes in her father and maternal grandmother; and Heart disease in her father. Allergies  Allergen Reactions  . Aspirin   . Ciprofloxacin   . Gabapentin     REACTION: SOB  . Nsaids      Review of Systems denies fever, visual loss, sob, depression, arthralgias, galactorrhea, excessive diaphoresis, muscle weakness, and numbness.  She has chronic  headaches, abdominal pain, eczema, muscle cramps, numbness, and diarrhea. She has no menses since 2010 tah.  She attributes urinary frequency to interstitial cystitis.       Objective:   Physical Exam VS: see vs page GEN: no distress HEAD: head: no deformity eyes: no periorbital swelling, no proptosis external nose and ears are normal mouth: no lesion seen NECK: supple, thyroid is not enlarged CHEST WALL: no deformity CV: reg rate and rhythm, no murmur ABD: abdomen is soft, nontender.  no hepatosplenomegaly.  not distended.  no hernia.  There is redundant skin, and a healed midline surgical scar. MUSCULOSKELETAL: muscle bulk and strength are grossly normal.  no obvious joint swelling.  gait is normal and steady EXTEMITIES: no deformity.  no ulcer on the feet.  feet are of normal color and temp.  no edema PULSES: dorsalis pedis intact bilat.  no carotid bruit NEURO:  cn 2-12 grossly intact.   readily moves all 4's.  sensation is intact to touch on the feet SKIN:  Normal texture and temperature.  No rash or suspicious lesion is visible.   NODES:  None palpable at the neck. PSYCH: alert, oriented x3.  Does not appear anxious nor depressed.     i reviewed ogtt Assessment & Plan:  S/p very successful gastric bypass surgery.  This success massively outweighs any adverse effect. Severe postprandial hypoglycemia, maybe partially related to dumping Pt has hyperglycemia also.  As this indicates dysfunctional insulin secretion, it is contributing to hypoglycemia

## 2011-02-09 NOTE — Patient Instructions (Signed)
Start actos 15 mg daily.  Here are some samples. You can also help this by eating smaller, more frequent meals. Please make a follow-up appointment in 3 months.

## 2011-02-25 ENCOUNTER — Other Ambulatory Visit: Payer: Self-pay | Admitting: Gastroenterology

## 2011-02-25 NOTE — Telephone Encounter (Signed)
NEEDS ENDOSCOPY IN [place]!!

## 2011-03-10 NOTE — Procedures (Signed)
NAMESHELLY, SPENSER           ACCOUNT NO.:  [id number]   MEDICAL RECORD NO.:  [id number]          PATIENT TYPE:  OUT   LOCATION:  SLEEP CENTER                 FACILITY:  [facility]   PHYSICIAN:  Clinton D. Maple Hudson, MD, FCCP, FACPDATE OF BIRTH:  May 04[birth year] 1972   DATE OF STUDY:  11/30/2007                            NOCTURNAL POLYSOMNOGRAM   REFERRING PHYSICIAN:  Pollyann Savoy, M.D.   INDICATION FOR STUDY:  Hypersomnia with sleep apnea.   EPWORTH SLEEPINESS SCORE:  18/24.  BMI 27.  Age 40 years.  Weight 140  pounds.  Height 60 inches.  Neck 13 inches.   MEDICATIONS:  Home medication charted and reviewed including significant  sedating medications, Ambien and clonazepam.   SLEEP ARCHITECTURE:  Total sleep time 337.5 minutes with sleep  efficiency 86%.  Stage I was 10.5%, stage II was 85%, stage III was  absent, REM 4.4% of total sleep time.  Sleep latency 27.5 minutes.  REM  latency 99.5 minutes.  Awake after sleep onset 30 minutes.  Arousal  index 1.8.  Bedtime medication included Ultram, Clonazepam, Flexeril,  Valtrex, Low-Ogestrel, vitamin D, Tums and fish oil.  Finally, Ambien  was taken at 2248 P.M.   RESPIRATORY DATA:  Apnea/hypopnea index (AHI) 0.  Respiratory  disturbance index (RDI) was also 0.  There were no respiratory events  recorded.   OXYGEN DATA:  No snoring noted by the technician.   CARDIAC DATA:  Normal sinus rhythm.   MOVEMENT-PARASOMNIA:  No significant limb movement disturbance.  No  bathroom trips.   IMPRESSIONS-RECOMMENDATIONS:  1. Unremarkable sleep architecture for sleep center environment except      for reduced percentage time spent in REM.  Significant amount of      sedating medication taken at bedtime.  2. No respiratory disturbance during sleep, apnea/hypopnea index 0 per      hour with no snoring.  Mean oxygen saturation was 95.9% on room      air.  3. This patient had a previous sleep study on January 12, 2005 recording      an apnea/hypopnea  index of 27 per hour consistent with moderate      obstructive sleep apnea at that time.  At the time of that study      she weighed 225 pounds.  She was subsequently titrated to a CPAP of      12 CWP.  Note that on the current study her weight is      recorded as 140 pounds.  That significant weight loss would be      quite likely to have caused improvement in a sleep apnea pattern.      Clinton D. Maple Hudson, MD, [facility], FACP  Diplomate, Biomedical engineer of Sleep Medicine  Electronically Signed     CDY/MEDQ  D:  12/03/2007 17:24:50  T:  12/05/2007 11:01:09  Job:  [id number]

## 2011-03-10 NOTE — Op Note (Signed)
[name], [name]           ACCOUNT NO.:  [id number]   MEDICAL RECORD NO.:  [id number]          PATIENT TYPE:  AMB   LOCATION:  SDC                           FACILITY:  [facility]   PHYSICIAN:  Michelle L. Grewal, M.D.DATE OF BIRTH:  1971/04/04   DATE OF PROCEDURE:  11/16/2008  DATE OF DISCHARGE:                               OPERATIVE REPORT   PREOPERATIVE DIAGNOSIS:  Pelvic pain, rule out interstitial cystitis,  rule out endometriosis.   POSTOPERATIVE DIAGNOSES:  1. Endometriosis involving the bladder flap, the right pelvic      sidewall.  2. Uterine and cervical prolapse.  3. Interstitial cystitis.   PROCEDURE:  Open laparoscopy, fulguration of endometriosis.   SURGEON:  Michelle L. Vincente Poli, MD   ANESTHESIA:  General.   FINDINGS:  Endometriosis on the bladder flap in the right ovarian  sidewall.   PATHOLOGY:  None.   ESTIMATED BLOOD LOSS:  Minimal.   COMPLICATIONS:  None.   PROCEDURE:  The patient was taken to the operating room.  She was  intubated.  She was prepped and draped in sterile fashion.  In-and-out  catheter was used to empty the bladder.  Exam under anesthesia revealed  the patient had grade II-III cervical uterine prolapse, uterine  manipulator was inserted.  A small infraumbilical incision was made.  We  then went down and identified the fascia.  We elevated the fascia using  Kocher clamps, entered the fascia using Mayo scissors, and then entered  the peritoneum bluntly with a hemostat.  There were no adhesions noted.  [facility] trocar was inserted.  Pneumoperitoneum was performed.  Laparoscope was introduced through the trocar sheath.  The patient was  placed in Trendelenburg position.  No adhesions were noted.  A 5-mm  trocar was placed suprapubically under direct visualization.  At this  point, I inspected the pelvis.  There were cluster or powder burn  lesions noted along the bladder flap on the left side.  I was able to  fulgurate that without  difficulty.  The left tube and ovary appeared  normal.  The right ovary underneath and along the sidewall, there was a  large splattering of endometriotic implants, but this was over the  ureter was I was unable to fulgurate this.  There were no adhesions.  There was no other areas of endometriosis.  I do think this was  contributing to her  pelvic pain.  The pneumoperitoneum was released.  The trocars were removed.  The incisions were closed with 3-0 Vicryl.  At this point, I scrubbed out.  All sponge, lap, and instrument counts  were correct x2.  Dr. Sherron Monday scrubbed in and that  portion of the procedure will be dictated by him.  Our plan of care will  be that if she does have interstitial cystitis that she will have Depo  Lupron for 3-6 months, followed by probable hysterectomy and I think we  could do an open LAVH-BSO.      Michelle L. Vincente Poli, M.D.  Electronically Signed     MLG/MEDQ  D:  11/16/2008  T:  11/16/2008  Job:  [id number]

## 2011-03-10 NOTE — Op Note (Signed)
NAMEDEVIKA, DRAGOVICH           ACCOUNT NO.:  [id number]   MEDICAL RECORD NO.:  [id number]          PATIENT TYPE:  OIB   LOCATION:  9319                          FACILITY:  [facility]   PHYSICIAN:  Martina Sinner, MD DATE OF BIRTH:  [birth year]   DATE OF PROCEDURE:  DATE OF DISCHARGE:                               OPERATIVE REPORT   PREOPERATIVE DIAGNOSES:  Stress incontinence and interstitial cystitis.   POSTOPERATIVE DIAGNOSES:  Stress incontinence and interstitial cystitis.   SURGERY:  Sling cystourethropexy plus cystoscopy.   SURGEON:  Scott A. MacDiarmid, MD   ASSISTANTStann Mainland. Vincente Poli, MD   Ms. Rankin has mild mixed stress and urge incontinence.  She underwent a  hysterectomy by Dr. Vincente Poli.  The cuff was opened when I entered the  room.  She had virtually no cystocele and a very small rectocele on  physical examination under anesthesia.  It was clear that she did not  require a cystocele repair and this did not surprise me.   I made two 1-cm incisions one fingerbreadth above the symphysis pubis,  1.5 cm lateral to the midline.  I made a 2.5-cm incision underlying the  mid urethra.  I instilled 7 mL of lidocaine epinephrine.  I made an  appropriate deep incision.  I sharply dissected the urethrovesical angle  bilaterally.  With the bladder empty, I passed a spark needle on top  along the back of the symphysis pubis __________ my index finger  bilaterally.  I cystoscoped the patient, there was no injury to the  bladder or urethra.  There is no indentation of the bladder with  wiggling of the trocar.  There was excellent blue jets bilaterally.   With the bladder empty, I attached the spark sling and brought it up  through the retropubic space and tensioned over the fat part of moderate  size Kelly __________.  I cut below the blue dots and removed the  sheath.  I was very pleased with the position of the sling, its  hypermobility, and its lack of spring back.   Irrigation was utilized.  Sling was cut below the skin.  A 4-0 Vicryl  and Dermabond was used for the abdominal incisions.  A 2-0 Vicryl  running suture followed by 2 interrupted were used for the vaginal  incision.  A vaginal pack was applied.  The patient was given  preoperative antibiotics, and she appeared to develop a rash from the  ciprofloxacin.   Ms. Massingale operation went very well.  Hopefully, this will reach her  treatment goal.  I was cognizant of the fact that her incontinence was  following the acute interstitial cystitis when tenting the sling.          ______________________________  Martina Sinner, MD  Electronically Signed    SAM/MEDQ  D:  02/18/2009  T:  02/18/2009  Job:  [id number]

## 2011-03-10 NOTE — Op Note (Signed)
NAMEYAMIL, [name]           ACCOUNT NO.:  [id number]   MEDICAL RECORD NO.:  [id number]          PATIENT TYPE:  OIB   LOCATION:  9319                          FACILITY:  [facility]   PHYSICIAN:  Michelle L. Grewal, M.D.DATE OF BIRTH:  24-May-1971   DATE OF PROCEDURE:  02/18/2009  DATE OF DISCHARGE:                               OPERATIVE REPORT   PREOPERATIVE DIAGNOSES:  Endometriosis, interstitial cystitis, pelvic  pain, and pelvic relaxation.   POSTOPERATIVE DIAGNOSES:  Endometriosis, interstitial cystitis, pelvic  pain, and pelvic relaxation.   PROCEDURE:  Laparoscopic-assisted vaginal hysterectomy and bilateral  salpingo-oophorectomy.  Dr. Sherron Monday performed a [facility] Sling with  cystoscopy.   SURGEON:  Michelle L. Vincente Poli, MD   ANESTHESIA:  General.   FINDINGS:  Small amount endometriosis on the right side wall and grade  III uterine prolapse.   PROCEDURE:  The patient was taken to the operating room after informed  consent was obtained.  She was consented on the risk associated with the  procedure which include infection, bleeding, injury to internal organs,  risk of anesthesia, risk of pulmonary embolism and deep vein  thromboembolism, and risk of recurrent prolapse, and risk of  dyspareunia.  After she was intubated, she was prepped and draped in  usual sterile fashion and a Foley catheter was inserted.  Exam under  anesthesia revealed that she had a grade 3 cervical uterine prolapse.  She actually had fairly good support of the posterior vaginal wall and  she had some relaxation of the anterior vaginal wall.  We placed the  uterine manipulator into the uterus.  Attention was turned to abdomen.  Open laparoscopy was performed by making a small incision with a  scalpel, grasping the fascia with Allis clamps, entering the fascia  using Mayo scissors, and placing the Hasson trocar after the angle  stitches were placed with 0 Vicryl suture.  Pneumoperitoneum was  performed.   No adhesions were noted.  A laparoscope was introduced  through the trocar sheath.  The patient was placed in Trendelenburg  position.  A 5-mm trocar was placed suprapubically under direct  visualization.  Exam revealed the following; the uterus was severely  prolapsed into the vagina.  We had to elevate it a lot with the uterine  manipulator.  The ovaries were free.  No endometriosis was noted.  There  was some small powder burn lesions over on the left round ligament and  the left IP, but otherwise the pelvis was clear.  Of note, she had had 3  shots of Depo Lupron and I think it helped her endometriosis  tremendously.  We identified the ureters on their normal location.  We  placed the EnSeal instrument just beneath the ovary on the right side  and across the IP ligament with careful attention to avoid the ureter,  burned, cauterized, and carried it down to the round ligament with  excellent hemostasis.  This was done identically on the left side.  Ureteral peristalsis was noted on both sides after this was done.  At  this point, we then released the pneumoperitoneum and went down  vaginally.  I  placed a weighted speculum in the vagina and placed a  tenaculum on the cervix.  We were able to pull the uterus and cervix all  the way out, but made in a small linear circumferential incision with  the Bovie around the cervix, entered the posterior cul-de-sac using  curved Mayo scissors and entered the anterior cul-de-sac using  Metzenbaum scissors.  We then used curved Heaney clamps and clamped the  uterosacral cardinal ligaments on either side.  Each pedicle was  clamped, cut, and suture ligated using 0 Vicryl suture.  We walked our  way up the broad ligament and each pedicle was suture ligated using 0  Vicryl suture.  Once we reached the level of the fundus, the uterus was  retroflexed.  The remainder of the broad ligament was clamped on either  side.  The specimen was removed and then the  pedicles were secured using  a free tie and a suture ligature of 0 Vicryl suture on each side.  No  bleeding was noted.  The posterior cuff was closed in a running locked  stitch using 0 Vicryl suture.  The cuff was then closed completely using  0 Vicryl interrupted.  At this point, Dr. Sherron Monday scrubbed in.  We  evaluated the vagina.  She had excellent support of the vaginal cuff,  and after the hysterectomy, I noted on exam that she had a small  perineal opening and she had minimal relaxation of the posterior vaginal  wall.  I felt she definitely did not need a posterior repair because it  would narrow her vagina and could cause dyspareunia.  Dr. Sherron Monday  also like was felt that she had enough support anteriorly that he should  just do a [facility] Sling.  So that part of the procedure will be dictated  by him.  He performed the [facility] Sling and cystoscopy.  At the end of  that, I then went back up to the abdomen, replaced the pneumoperitoneum,  irrigated the pelvis.  Everything was completely hemostatic.  We  released the pneumoperitoneum, closed the incisions, tied down the  Hasson stitches, and closed each incision with interrupted using 3-0  Vicryl, and placed Dermabond at each incision site.  All sponge, lap,  and instrument counts were correct x2.  The patient went to recovery  room in stable condition.      Michelle L. Vincente Poli, M.D.  Electronically Signed     MLG/MEDQ  D:  02/18/2009  T:  02/18/2009  Job:  [id number]

## 2011-03-10 NOTE — Op Note (Signed)
NAMEGERMAINE, RIPP           ACCOUNT NO.:  [id number]   MEDICAL RECORD NO.:  [id number]          PATIENT TYPE:  AMB   LOCATION:  SDC                           FACILITY:  [facility]   PHYSICIAN:  Martina Sinner, MD DATE OF BIRTH:  [birth year]   DATE OF PROCEDURE:  11/16/2008  DATE OF DISCHARGE:                               OPERATIVE REPORT   PREOPERATIVE DIAGNOSIS:  Pelvic pain.   POSTOPERATIVE DIAGNOSES:  1. Pelvic pain.  2. Interstitial cystitis.   SURGERY:  Cystoscopy, hydrodistention of bladder and installation  therapy.   SURGEON:  Scott A. 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  She had a laparoscopy by Dr.  Vincente Poli that was positive for endometriosis.  She does have prolapse  under anesthesia.   The patient was prepped and draped in usual fashion.  A 21-French scope  was utilized.  Bladder mucosa and trigone were normal.  There was no  suspicion of foreign body or carcinoma.  She was hydrodissected 650 mL.  On reinspection, she had diffuse glomerulations especially in the floor  and at the dome of the bladder.  They were mild but diffuse.  There were  no ulcers.  Bladder was emptied and I instilled 20 mL of 0.5% Marcaine  with 400 mg of Pyridium.  The patient was stable.   The patient will be treated medically for interstitial cystitis and  endometriosis on a short-term and may require prolapse surgery in the  future depend upon her treatment goals and response to medical therapy.           ______________________________  Martina Sinner, MD  Electronically Signed     SAM/MEDQ  D:  11/16/2008  T:  11/16/2008  Job:  [id number]

## 2011-03-13 NOTE — Op Note (Signed)
NAMEJONIAH, [name]                       ACCOUNT NO.:  [id number]   MEDICAL RECORD NO.:  [id number]                   PATIENT TYPE:  AMB   LOCATION:  DAY                                  FACILITY:  [facility]   PHYSICIAN:  Amanda Park. Daphine Calderon, M.D.             DATE OF BIRTH:  October 14, 1971   DATE OF PROCEDURE:  04/03/2003  DATE OF DISCHARGE:                                 OPERATIVE REPORT   CVS NO.:  [id number]   PREOPERATIVE DIAGNOSIS:  Gastroesophageal reflux disease.   POSTOPERATIVE DIAGNOSIS:  Gastroesophageal reflux disease.   PROCEDURE:  Laparoscopic Nissan fundoplication (50 bougeed done with a two-  suture closure of the hiatus, and a three-suture wrap).   SURGEON:  Amanda Park. Daphine Calderon, M.D.   ASSISTANT:  [name], M.D.   ANESTHESIA:  General endotracheal.   DESCRIPTION OF PROCEDURE:  The patient is a 40 year old white female who has  had severe gastroesophageal reflux disease, controlled with proton pump  inhibitors to a pretty great extent.  Preoperative upper GI was positive for  minimal Schatzki's ring and a sliding hiatal hernia.  Normal esophageal  motility is present.   DESCRIPTION OF PROCEDURE:  The patient was taken to room 1 and given general  anesthesia.  The abdomen was prepped with Betadine and draped sterilely.  We  used the OptiView port to enter the abdomen through the left upper quadrant.  The abdomen was insufflated, and with that this port tended to drift more  toward the midline.  This was a 10-11 port.  I then went into the  supraumbilical region and placed another port.  I also pulled some  peritoneal fat out of a umbilical hernia that lay well below this, which was  separate and apart from all this.  The port was placed over on the right  side, and then a 5 mm in the upper midline for the placement of the  [facility] liver retractor.  This was affixed to the table and retracted at a  left lateral segment.  Because of her BMI of 39, the proximal  dissection  being high, I was obscured somewhat by adiposity.  First I incised the  gastrohepatic window and dissected the right crus through this anteriorly  across the EG junction.  I noted the anterior vagus nerve and I left it  undisturbed.  '   Next, I went over and grasped the greater curvature of the stomach; I began  taking down short gastrics.  That is where we realized how high this was and  how adherent this was to the left crus.  I did a complete resection,  although somewhat tediously, but we got this well mobilized and taken down  off of the spleen.  Clips were occasionally used on the splenic side, but in  general those vessels were divided with the harmonic scalpel.  Once divided  and mobilized, I then created a retroesophageal window.  Penrose drain was  passed, and this was used to elevate and further delineate the crus and the  stomach.  I then was able to grate around and grasp the stomach; it came  around easily for a nice floppy-appearing fundoplication.   Two sutures were then placed in the crura and tied extra-corporally; then  cinched down the crura nicely.  The 50 lighted bougie then passed and seemed  to be snug but not tight; it did pass easily.  Around that I then fashioned  a wrap and felt this would be a nice floppy Nissan fundoplication.  The  bougie was withdrawn and I then sutured the wrapped stomach, and then  fastened the fundoplication with three suture -- using the Endo stitch and a  nonabsorbable suture, getting purchases of esophagus with each bite.  These  were tied down, anchoring the wrap above the EG junction.   The patient seemed to tolerate the procedure well.  We inspected everything  and no evidence was bleeding was noted.  Port sites were all inspected and  no evidence of bleeding.  The stomach and the rest of the small intestine  area were well inspected; there was no evidence of any other bleeding or  other disturbances.  Port sites  were injected with lidocaine.  Since they  were placed obliquely, no sutures were placed.  The abdomen was deflated and  trocars were withdrawn.  The skin incisions were closed with 4-0 Vicryl,  Benzoin and Steri-Strips applied.  The patient seemed to tolerate the  procedure well; was taken to the recovery room in satisfactory condition.                                               [name], M.D.    MBM/MEDQ  D:  04/03/2003  T:  04/03/2003  Job:  [id number]   cc:   [name], M.D.  [address]  [place]  [place] [address]  Fax: [phone]   [name], M.D.  [address]  [place]  [place] [address]  Fax: [phone]

## 2011-03-13 NOTE — Op Note (Signed)
   [name], [name]                       ACCOUNT NO.:  [id number]   MEDICAL RECORD NO.:  [id number]                   PATIENT TYPE:  AMB   LOCATION:  ENDO                                 FACILITY:  [facility]   PHYSICIAN:  Danise Edge, M.D.                DATE OF BIRTH:  1971/07/28   DATE OF PROCEDURE:  01/16/2003  DATE OF DISCHARGE:  01/16/2003                                 OPERATIVE REPORT   PROCEDURE:  Esophageal manometry.   INDICATIONS:  The patient is a 40 year old female born 05/31/1971.  The patient  has gastroesophageal reflux.  Preoperative esophageal manometry is  scheduled.   FINDINGS:  Lower esophageal sphincter function:  A resting lower esophageal  sphincter pressure is 27.7 mmHg, which is normal.  Swallow-induced  relaxation of the lower esophageal sphincter is 66% with a residual pressure  of only 9 mmHg.  This does not indicate achalasia.  Lower esophageal  sphincter function appears to be normal.   Esophageal body function:  Ten wet swallows resulted in 100% peristaltic  contractions of the esophageal body.  The average wave amplitude is 142  mmHg, which is well within the normal range.  Esophageal body function is  normal.   Upper esophageal sphincter function:  Peristaltic and coordinated.   IMPRESSION:  Normal esophageal manometry.                                               Danise Edge, M.D.    MJ/MEDQ  D:  01/26/2003  T:  01/27/2003  Job:  [id number]

## 2011-03-13 NOTE — Op Note (Signed)
NAMESIDDALEE, VANDERHEIDEN           ACCOUNT NO.:  [id number]   MEDICAL RECORD NO.:  [id number]          PATIENT TYPE:  INP   LOCATION:  0153                         FACILITY:  [facility]   PHYSICIAN:  Thornton Park. Daphine Deutscher, MD  DATE OF BIRTH:  1971-06-14   DATE OF PROCEDURE:  03/29/2006  DATE OF DISCHARGE:                                 OPERATIVE REPORT   PREOPERATIVE DIAGNOSIS:  Morbid obesity, weight 221 pounds, height 60  inches; status post laparoscopic Nissen in 2004, which was successful for  reflux, for a laparoscopic Roux-en-Y gastric bypass after takedown of  laparoscopic Nissen.   POSTOPERATIVE DIAGNOSES:  1.  Morbid obesity, status post takedown of Nissen fundoplication and Roux-      en-Y gastric bypass laparoscopically.  2.  Open repair of umbilical hernia.   SURGEON:  Thornton Park. Daphine Deutscher, M.D.   ASSISTANT:  Sandria Bales. Ezzard Standing, M.D.   DESCRIPTION OF PROCEDURE:  Ms. Bookwalter is a 40 year old white female who was  taken to room #1 on March 29, 2006.  After the abdomen was prepped widely with  chlorhexidine and draped sterilely, I entered through the left upper  quadrant using an OptiVu technique without difficulty.  The abdomen was  insufflated.  She had numerous adhesions from her previous trocar sites from  her Nissen where the omentum had stuck up in these respective trocar sites.  I took these out and took these down with harmonic dissection, but she did  have an umbilical hernia, which was separate from her trocar sites, which  was incarcerated with omentum.  I took this down using a harmonic scalpel  prior to placing my ports.  Prior to completing the case, I made a smiley  infraumbilical incision and repaired this defect after creating 1 single  defect with interrupted a running 0 Prolenes transversely; that was the  final part of the procedure that I did prior to closure.   Once I got into the abdomen at the beginning of the procedure, I put in the  [place] retractor up in  the upper abdomen and then began with a harmonic  scalpel and with sharp dissection to take down the Nissen from the liver.  I  did this with sharp mobilization.  I then went down and took down the Nissen  fundoplication and tediously dissected the wrap away and this took me an  hour and 20 minutes to completely take down her previous laparoscopic  Nissen.  As I said, this had functioned well and she had no evidence of  reflux prior to doing the surgery.  I then brought the wrapped stomach back  around into its anatomic position.  While there, I went ahead and measured  about 4 cm down, dissected in along the lesser curvature and then with  multiple applications of a echelon stapler, first using the bronze cartridge  and then with blue cartridges, I cut out a small pouch of stomach.  I used  the Ewald tube to help size this initially.  When I completed this  resection, there were a couple of bleeders up on the stomach and its remnant  were clipped with an Endo clip.  I also covered this area with Tisseel.   The pouch having been completed, I went ahead and went down and identified  the ligament of Treitz and marched down 40 cm, where I divided it with the  echelon.  I put a marker on the Roux-en-Y limb and then counted 100 cm and  then did a functional end-to-end from the biliopancreatic limb to the  antimesenteric side of the Roux limb.  This common defect was created with a  45 white stapler and the common hole was closed from above and below with  running 2-0 Vicryls.  Tisseel was applied to this.  I then closed the  mesenteric defect initially with a running 2-0 silk, but I had to back off  that because I got some bleeding from the Roux limb side and also I tended  to be gathering it a little too closely, so I went back and put a separate  anti-obstruction stitch and then closed it with interrupteds.   I subsequently closed Peterson's with a suture from the transverse colon to  the  end of the Roux limb and also a little below that between the colonic  mesentery and the mesentery of the Roux limb.   The Roux limb was brought up and a back wall was placed of running 2-0  Vicryl.  I opened the stomach and on the antecolic, left-facing candy-cane,  I then inserted the 45 stapler.  This went in easily and was fired.  The  common defect was closed with two 2-0 Vicryls and then the second layer  using 2-0 Vicryls on a regular needle with lap ties at either end were used.  This was done over a Ewald tube through the anastomosis.   Dr. Ezzard Standing then scoped the patient with me clamping off the efferent limb  and submerging the pouch and the remnant under saline and the anastomosis  under saline.  No bubbles were seen.  The pouch looked good and no bleeding  was noted.  The small pouch and then decompressed.  Tisseel was applied.   I then repaired at this point a very visible umbilical hernia through an  infraumbilical incision with interrupted sutures of 0 Prolene transversely.  This area was injected with some Marcaine.  I then reinspected everything  and everything looked good.  All adhesions to the anterior abdominal wall  had been taken down.  The abdomen was then deflated and wounds were closed  with 4-0 Vicryl subcutaneously and subcuticularly and with then staples.  The patient tolerated the procedure well and was taken to the recovery room  in satisfactory condition.      Thornton Park Daphine Deutscher, MD  Electronically Signed     MBM/MEDQ  D:  03/29/2006  T:  03/30/2006  Job:  [id number]   cc:   Lelon Perla, M.D.  [address] [place], [place] [address]   Venita Lick. Russella Dar, M.D. [facility]  [address]  [place]  [place] [address]

## 2011-03-13 NOTE — Assessment & Plan Note (Signed)
[facility]                               PULMONARY OFFICE NOTE   [name], [name]                    MRN:          [id number]  DATE:05/12/2006                            DOB:          1971/06/08    PROBLEMS:  1.  Obstructive sleep apnea with hypersomnia.  2.  Seasonal rhinitis.  3.  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  4.  Fibromyalgia.   HISTORY:  She had gastric bypass in June and is still recuperating but is  now more able to roll from side-to-side comfortably.  She has already lost  over 20 pounds.  She complains now that CPAP set at 12 CWP with a nasal  pillows mask is too cool.  She thinks she has a heated humidifier but it  doesn't work as well as her Forensic psychologist.  She has had some morning  nasal congestion which has not yet been significant.   MEDICATIONS:  1.  Requip 1 mg at h.s.  2.  CPAP at 12 CWP, otherwise she is off virtually all medications.   ALLERGIES:  Drug intolerance to NEURONTIN.   OBJECTIVE:  VITAL SIGNS:  Weight 201 pounds.  Blood pressure 130/70.  Pulse  103.  Room air saturation 98%.  GENERAL:  She is alert with no pressure marks on her face.  HEENT:  Nasal mucosa looks normal.  Pharynx is clear.  LUNGS:  Clear with breathing unlabored.  HEART:  Heart sounds regular without murmur. There is no restlessness or  tremor.   IMPRESSION:  Obstructive sleep apnea with some loss of continuous positive  airway pressure comfort, quite possibly reflecting her weight loss.   PLAN:  1.  We will have [employer] reduce her continuous positive airway pressure from      12 to 11 CWP and check her humidifier.  She understands that if she      continues to lose weight we may want to turn [name] the pressure more and      with luck she will be able to come off of continuous      positive airway pressure eventually.  2.  Schedule return in 3 months, earlier as needed.     Clinton D. Maple Hudson, MD, [facility], FACP   CDY/MedQ  DD:  05/12/2006  DT:  05/13/2006  Job #:  [id number]   cc:   Loreen Freud, MD  Thornton Park. Daphine Deutscher, MD  Aundra Dubin, MD

## 2011-03-13 NOTE — Op Note (Signed)
NAMETERRICKA, ONOFRIO                       ACCOUNT NO.:  [id number]   MEDICAL RECORD NO.:  [id number]                   PATIENT TYPE:  AMB   LOCATION:  ENDO                                 FACILITY:  [facility]   PHYSICIAN:  Danise Edge, M.D.                DATE OF BIRTH:  1970-12-11   DATE OF PROCEDURE:  12/27/2002  DATE OF DISCHARGE:                                 OPERATIVE REPORT   PROCEDURE:  Esophagogastroduodenoscopy.   INDICATIONS FOR PROCEDURE:  Ms. [name] is a 40 year old female  born [birth year].  Ms. [name] has chronic gastroesophageal reflux  manifested predominantly by heartburn but also associated with symptoms  suggestive of early satiety and a globus type sensation in her throat. She  denies dysphagia or odynophagia. Her symptoms used to respond to Axid. She  is now requiring Nexium 40 mg twice daily. On some days, she has  breakthrough heartburn and regurgitation despite Nexium.   Ms. [name] tells me she underwent an upper GI x-ray series at [facility] and was told she had moderately severe gastroesophageal reflux and  a hiatal hernia. I do not have a copy of this report. She also underwent a  CT scan of the abdomen and pelvis February 20, 2002 which revealed fatty  infiltration of the liver but no other abnormality.   Ms. [name] is being assessed for laparoscopic antireflux surgery and is  undergoing a preoperative esophagogastroduodenoscopy.   ALLERGIES:  NEURONTIN.   CURRENT MEDICATIONS:  Nexium, Wellbutrin, Zanaflex, Ultram, Zovirax, Ortho  Tri-Cyclen.   PAST MEDICAL HISTORY:  Gastroesophageal reflux, depression, chronic  headaches, herpes, hypercholesterolemia, remote cesarean section.   HABITS:  Ms. [name] smokes 1/2 pack of cigarettes per day. She rarely  consumes alcohol.   FAMILY HISTORY:  Ms. [name] 62 year old father is in poor health due to  hypertension and diabetes. Her 3 year old mother is in fair health  with  hypertension and hypercholesterolemia. Her two brothers and one sister are  in good health.   SOCIAL HISTORY:  Ms. [name] 26 year old husband is in good health. Her 57-  year-old son is in good health. 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   Weight 195 pounds, height 60 1/2 inches.   ENDOSCOPIST:  Danise Edge, M.D.   PREMEDICATION:  Versed 5 mg, Demerol 50 mg.   ENDOSCOPE:  Olympus gastroscope.   DESCRIPTION OF PROCEDURE:  After obtaining informed consent, [name] was  placed in the left lateral decubitus position. I administered intravenous  Demerol and intravenous Versed to achieve conscious sedation for the  procedure. The patient's blood pressure, oxygen saturation and cardiac  rhythm were monitored throughout the procedure and documented in the medical  record.   The Olympus gastroscope was passed through the posterior hypopharynx into  the proximal esophagus without difficulty. The hypopharynx, larynx and vocal  cords appeared normal.   ESOPHAGOSCOPY:  The proximal, mid and lower  segments of the esophageal  mucosa appear completely normal. The squamocolumnar junction and  esophagogastric junction are noted at 38 cm from the incisor teeth. There is  no endoscopic evidence for the presence of Barrett's esophagus, erosive  esophagitis, esophageal mucosal scarring.   GASTROSCOPY:  I did not detect a hiatal hernia by  esophagogastroduodenoscopy. Retroflexed view of the gastric cardia and  fundus was normal. The gastric body, antrum and pylorus appeared normal.   DUODENOSCOPY:  The duodenal bulb and descending duodenum appeared normal.   ASSESSMENT:  Completely normal esophagogastroduodenoscopy despite Ms.  [name]'s history of chronic gastroesophageal reflux. She would appear to be  a good candidate for antireflux surgery. She will require an esophageal  manometry preoperatively.                                               Danise Edge,  M.D.    MJ/MEDQ  D:  12/27/2002  T:  12/27/2002  Job:  [id number]   cc:   Dellis Anes. Idell Pickles, M.D.  [address]  [place]  [place] [address]  Fax: [phone]   Thornton Park. Daphine Deutscher, M.D.  [address]  [place]  [place] [address]  Fax: [phone]

## 2011-03-13 NOTE — Op Note (Signed)
[name], [name]           ACCOUNT NO.:  [id number]   MEDICAL RECORD NO.:  [id number]          PATIENT TYPE:  INP   LOCATION:  0153                         FACILITY:  [facility]   PHYSICIAN:  Sandria Bales. Ezzard Standing, M.D.  DATE OF BIRTH:  12-27-1970   DATE OF PROCEDURE:  03/29/2006  DATE OF DISCHARGE:                                 OPERATIVE REPORT   PREOPERATIVE DIAGNOSIS:  Morbid obesity, with a body mass index of  approximately 40, with prior Nissen fundoplication.   POSTOPERATIVE DIAGNOSIS:  Morbid obesity, with a body mass index of 40, with  prior Nissen fundoplication.   OPERATION:  Esophagogastroduodenoscopy   SURGEON:  Sandria Bales. Ezzard Standing, M.D.   FIRST ASSISTANT:  No first assistant.   ANESTHESIA:  General endotracheal.   ESTIMATED BLOOD LOSS:  None.   NATURE OF PROCEDURE:  Ms. Hasegawa is a 40 year old female who has undergone a  laparoscopic takedown of a prior Nissen fundoplication and a creation of a  Roux-Y gastric bypass for morbid obesity.  I am doing upper endoscopy for  documentation of the stomach pouch, mucosal lining, and the patency and  integrity of the anastomosis.   The patient was positioned under general anesthesia.  Dr. Daphine Deutscher was manning  the laparoscope while I did the upper endoscopy.   I passed an Olympus flexible endoscope down the esophagus without  difficulty.  I identified the gastric pouch and gastrojejunostomy  anastomosis at approximately 43 cm from the incisors.  It was widely patent.  Photos were taken.  The esophagogastric junction was at about 37 cm for  about a 6 cm pouch.  The mucosa of the pouch was viable.  I insufflated the  pouch while Dr. Daphine Deutscher filled the upper abdomen with saline.  There was no  evidence of any bubbling or air leak.  I then withdrew the scope through the  esophagus, which was unremarkable.   It was felt to be a normal anastomosis with an approximately 6 cm pouch,  with no evidence of leak.  Dr. Daphine Deutscher will dictate  the remainder of this  operation.      Sandria Bales. Ezzard Standing, M.D.  Electronically Signed     DHN/MEDQ  D:  03/29/2006  T:  03/30/2006  Job:  [id number]   cc:   Thornton Park Daphine Deutscher, MD  [address]  [place]  [place] [address]

## 2011-03-13 NOTE — Discharge Summary (Signed)
[name], SAYLOR           ACCOUNT NO.:  [id number]   MEDICAL RECORD NO.:  [id number]          PATIENT TYPE:  INP   LOCATION:  1613                         FACILITY:  [facility]   PHYSICIAN:  Thornton Park. Daphine Deutscher, MD  DATE OF BIRTH:  08-Sep-1971   DATE OF ADMISSION:  03/29/2006  DATE OF DISCHARGE:  04/03/2006                                 DISCHARGE SUMMARY   PROCEDURES.:  1.  Laparoscopic takedown of Nissen fundoplication and laparoscopic Roux-en-      Y gastric bypass and repair of umbilical hernia March 29, 2006.  2.  April 01, 2006, takedown of incarcerated supraumbilical ventral hernia      with primary repair.   COURSE IN HOSPITAL:  [name] was an a.m. admission on March 29, 2006,  and underwent a laparoscopic takedown of a previous Nissen  fundoplication in 2004 with Roux-en-Y gastric bypass and repair of a very  prominent umbilical hernia primarily.  The patient did well initially and  had a good upper GI the following day.  However within 24 hours of that,  started having some nausea and vomited.  This progressed, and a CT scan was  obtained on June 7 which showed some small bowel incarcerated in the  anterior abdominal wall.  She was taken emergently back to the OR where the  ventral hernia repair was examined and was a good intact repair, but I went  ahead and took that down and found instead an old trocar site remote from  the ventral hernia repair, a trocar hernia with incarceration.  I repaired  this primarily and relieved the obstruction.   Postoperatively her nausea abated.  She began back on liquids and progressed  and was ready for discharge on postop days 5/2.  Labs were fine.  Vital  signs were good, and she was having bowel movements, passing gas, and taking  liquids by mouth.  She will be followed up in the office.  Condition  improved.      Thornton Park Daphine Deutscher, MD  Electronically Signed     MBM/MEDQ  D:  04/03/2006  T:  04/03/2006  Job:  [id number]

## 2011-03-13 NOTE — Procedures (Signed)
[name], [name]           ACCOUNT NO.:  [id number]   MEDICAL RECORD NO.:  [id number]          PATIENT TYPE:  OUT   LOCATION:  SLEEP CENTER                 FACILITY:  [facility]   PHYSICIAN:  Clinton D. Maple Hudson, M.D. DATE OF BIRTH:  [birth year]   DATE OF STUDY:  03/18/2005                              NOCTURNAL POLYSOMNOGRAM   REFERRING PHYSICIAN:  Clinton D. Maple Hudson, M.D.   INDICATIONS FOR STUDY:  Hypersomnia with sleep apnea.   EPWORTH SLEEPINESS SCORE:  13/24, BMI 44, weight 225 pounds.   Baseline diagnostic NPSG on January 12, 2005, recorded RDI of 27 per hour.  CPAP titration is requested.   SLEEP ARCHITECTURE:  Total sleep time 389 minutes with sleep efficiency 88%.  Stage 1 was 12%; stage 2 was 63%; stages 3 and 4 were 20%, and REM was 6% of  total sleep time.  Sleep latency 18 minutes, REM latency 285 minutes.  Awake  after sleep onset 34 minutes.  Arousal index 20.  She took all of her  medications including Sonata at bedtime.   RESPIRATORY DATA:  CPAP titration protocol.  CPAP was titrated to 12 CWP,  RDI 0 per hour, using a Petite Comfort Lite 2 nasal mask with heated  humidifier.   OXYGEN DATA:  Snoring was prevented by CPAP.  With CPAP control, oxygen  saturation was held 96% on room air.   CARDIAC DATA:  Normal sinus rhythm with occasional PAC.   MOVEMENT/PARASOMNIA:  Frequent leg jerks but with little sleep disturbance.   IMPRESSION/RECOMMENDATION:  1.  Successful CPAP titration to 12 CWP, RDI 0 per hour, using a Petite      Comfort Lite 2 nasal mask with heated humidifier.  The technician also      added the chin strap.  2.  Baseline diagnostic nocturnal polysomnogram on January 12, 2005, recorded      RDI of 27 per hour.     CDY/MEDQ  D:  03/22/2005 15:55:13  T:  03/22/2005 17:56:24  Job:  [id number]

## 2011-03-13 NOTE — Procedures (Signed)
[name], [name] NO.:  [id number]   MEDICAL RECORD NO.:  [id number]          PATIENT TYPE:  OUT   LOCATION:  SLEEP CENTER                 FACILITY:  [facility]   PHYSICIAN:  Marcelyn Bruins, M.D. [facility] DATE OF BIRTH:  [birth year]   DATE OF STUDY:  01/12/2005                              NOCTURNAL POLYSOMNOGRAM   REFERRING PHYSICIAN:  Loreen Freud, MD   INDICATION FOR STUDY:  307.44, persistent disorder of initiating and  maintaining wakefulness.  Epworth sleepiness score: 14.   SLEEP ARCHITECTURE:  The patient had a total sleep time of 354 minutes with  decreased slow wave sleep and mildly decreased REM. A sleep onset latency  was prolonged at 56 minutes as was REM onset at 171 minutes. Sleep  efficiency was 82%.   IMPRESSION:  1.  Moderate obstructive sleep apnea/hypopnea syndrome with a respiratory      disturbance index of 27 events per hour and O2 desaturation as low as      87%. Events were not positional, however were more severe during REM.  2.  Loud snoring noted throughout the study.  3.  No clinically significant cardiac arrhythmias.  4.  Large numbers of leg jerks with mild sleep disruption. Clinical      correlation is suggested if the patient remains symptomatic after      adequate treatment of her obstructive sleep apnea.      KC/MEDQ  D:  01/22/2005 12:25:32  T:  01/22/2005 14:24:00  Job:  [id number]

## 2011-03-13 NOTE — Op Note (Signed)
NAMEALEXY, BRINGLE           ACCOUNT NO.:  [id number]   MEDICAL RECORD NO.:  [id number]          PATIENT TYPE:  INP   LOCATION:  1613                         FACILITY:  [facility]   PHYSICIAN:  Thornton Park. Daphine Deutscher, MD  DATE OF BIRTH:  June 17, 1971   DATE OF PROCEDURE:  04/01/2006  DATE OF DISCHARGE:                                 OPERATIVE REPORT   PREOPERATIVE DIAGNOSIS:  Incarcerated hernia with bowel obstruction.   POSTOPERATIVE DIAGNOSIS:  Incarcerated hernia in old Nissen fundoplication  trocar site in the supraumbilical region.   PROCEDURE:  Laparotomy and takedown of incarcerated bowel and closure of  hernia.   SURGEON:  Thornton Park. Daphine Deutscher, MD   ANESTHESIA:  General endotracheal.   DESCRIPTION OF PROCEDURE:  Ms. Heimsoth was taken urgently to room #11 on April 01, 2006 after I found on CT scan loop of bowel up in a hernia.  I assumed  that my hernia repair from her initial operation had broken down and pulled  out and then she herniated up into this area around her umbilicus.  I  therefore prepped her with Betadine, took out her staples, opened that  incision, which had closed nicely, and found no evidence of hernia and the  fascial closure was intact.  I removed my Prolene sutures and inserted my  finger above and found incarcerated hernia about an inch above in an old  trocar site from her Nissen fundoplication, which I had done several years  ago.  A loop of bowel was up in that.  I went ahead and made a longitudinal  incision basically following her previous longitudinal incision from her  Nissen and cut down on this and dissected down to the hernia.  There was a  pseudo sac which had formed.  I then could visualize some fluid around bowel  and I gently massaged this from the outside as well as provided gentle  traction inside and was able to get this reduced.  When this reduced, I then  opened this little sac and basically examine the bowel that I could see  beneath this  and saw no evidence of any strangulated bowel.  There was a  little area that looked pink.  I saw no foul-smelling liquid or anything to  the applied at this was necrotic bowel.  Rather than trying to open her up  and do a full laparotomy, I took time to try to gently palpate the bowel  from the 2 little holes that I had made in the fascia and bringing the loops  of bowel and looked at them.  This seemed did not reveal any evidence again  of any compromised bowel.  After doing this for approximately 30 minutes, I  went ahead and closed both fascial defects, the superior one first  transversely with interrupted #1 Novofils.  I put malleable from below and  placed those and then held them and then I went down below and placed and  repaired the umbilical defect from below.  I palpated that closure and then  closed it and then felt it from the inside and then tied  the sutures and the  other supraumbilical site.  All the bowel was kept out of these closures and  I then injected with 0.5%  Marcaine.  A 4-0 Vicryl was used in the subcutaneous tissue and then the  skin was closed staples.  A sterile dressing was applied.  The patient  seemed to tolerate the procedure well and was taken to the recovery room in  satisfactory condition  She will be returned to her room on the floor.      Thornton Park Daphine Deutscher, MD  Electronically Signed     MBM/MEDQ  D:  04/01/2006  T:  04/02/2006  Job:  [id number]

## 2011-03-13 NOTE — Assessment & Plan Note (Signed)
[facility]                               PULMONARY OFFICE NOTE   [name], [name]                    MRN:          [id number]  DATE:08/12/2006                            DOB:          October 02, 1971    FOLLOWUP   PROBLEMS:  1. Obstructive sleep apnea with hypersomnia.  2. Seasonal rhinitis.  3. Exogenous obesity, now status post bariatric surgery.  4. Fibromyalgia.   HISTORY:  She has lost about 30 pounds since last here and her husband tells  her she is no longer snoring at all.  She is having insomnia problems with  busy brain keeping her awake.  She sleeps at 3-4 hour intervals.  A little  mild body ache contributes to the insomnia but not much.  She complains that  __________  was too slow in response to service calls on her CPAP and she  took it back to them.  She feels she no longer needs it.   MEDICATIONS:  Requip 1 mg otherwise she is off virtually all medicines we  had listed before.   DRUG INTOLERANCE:  NEURONTIN.   OBJECTIVE:  VITAL SIGNS:  Weight 171 pounds.  BP 106/72.  Pulse regular at  96.  Room air saturation 97%.  GENERAL:  She is alert and seems comfortable.  Breathing is unlabored.  Pulse is regular.  No tremor.  Palms are dry.   IMPRESSION:  1. Obstructive sleep apnea clinically corrected by weight loss and no      longer needing continuous positive airway pressure after successful      bariatric surgery.  2. Mild seasonal allergic rhinitis.  Can be managed symptomatically.   PLAN:  1. She denies flu vaccine.  2. CPAP is discontinued.  3. Return here p.r.n.       Clinton D. Maple Hudson, MD, [facility], FACP      CDY/MedQ  DD:  08/14/2006  DT:  08/16/2006  Job #:  [id number]   cc:   Loreen Freud, M.D.  Thornton Park Daphine Deutscher, MD

## 2011-03-13 NOTE — Discharge Summary (Signed)
   NAMESHANIKA, LEVINGS                       ACCOUNT NO.:  [id number]   MEDICAL RECORD NO.:  [id number]                   PATIENT TYPE:  INP   LOCATION:  0445                                 FACILITY:  [facility]   PHYSICIAN:  Thornton Park. Daphine Deutscher, M.D.             DATE OF BIRTH:  1970/10/31   DATE OF ADMISSION:  04/03/2003  DATE OF DISCHARGE:                                 DISCHARGE SUMMARY   JJ   ADMITTING DIAGNOSES:  Gastroesophageal reflux disease.   DISCHARGE DIAGNOSES:  Gastroesophageal reflux disease status post  laparoscopic nissen fundoplication (#50 bougie) with two suture closure of  the hiatus in a three suture wrap.   HOSPITAL COURSE:  The patient is a 40 year old lady who underwent the above  mentioned laparoscopic nissen on April 03, 2003.  She was kept n.p.o. the  first day and begun on water and advanced to clear liquid diet.  She is  ready for discharge on April 06, 2003.  Vital signs stable.  She is  tolerating her liquid diet very well.  She was encouraged to get back on her  preoperative medications except for the Nexium which we will stop.   PLAN:  For her to return to see me in the office in approximately three  weeks.   CONDITION ON DISCHARGE:  Good.                                               Thornton Park Daphine Deutscher, M.D.    MBM/MEDQ  D:  04/06/2003  T:  04/06/2003  Job:  [id number]   cc:   Dellis Anes. Idell Pickles, M.D.  [address]  [place]  [place] [address]  Fax: [phone]   Danise Edge, M.D.  [address]  [place]  [place] [address]  Fax: [phone]

## 2011-04-11 ENCOUNTER — Encounter: Payer: Self-pay | Admitting: Family Medicine

## 2011-04-11 ENCOUNTER — Other Ambulatory Visit: Payer: Self-pay | Admitting: Family Medicine

## 2011-04-11 ENCOUNTER — Ambulatory Visit
Admission: RE | Admit: 2011-04-11 | Discharge: 2011-04-11 | Disposition: A | Discharge status: Home / Self Care | Payer: Managed Care, Other (non HMO) | Source: Ambulatory Visit | Attending: Family Medicine | Admitting: Family Medicine

## 2011-04-11 ENCOUNTER — Inpatient Hospital Stay (INDEPENDENT_AMBULATORY_CARE_PROVIDER_SITE_OTHER)
Admission: RE | Admit: 2011-04-11 | Discharge: 2011-04-11 | Disposition: A | Discharge status: Home / Self Care | Payer: Managed Care, Other (non HMO) | Source: Ambulatory Visit | Attending: Family Medicine | Admitting: Family Medicine

## 2011-04-11 DIAGNOSIS — R079 Chest pain, unspecified: Secondary | ICD-10-CM

## 2011-04-11 DIAGNOSIS — S2239XA Fracture of one rib, unspecified side, initial encounter for closed fracture: Secondary | ICD-10-CM

## 2011-04-15 ENCOUNTER — Telehealth (INDEPENDENT_AMBULATORY_CARE_PROVIDER_SITE_OTHER): Payer: Self-pay | Admitting: Emergency Medicine

## 2011-04-29 ENCOUNTER — Other Ambulatory Visit: Payer: Self-pay | Admitting: Gastroenterology

## 2011-04-30 NOTE — Telephone Encounter (Signed)
NEEDS OFFICE VISIT FOR ANY FURTHER REFILLS! 

## 2011-05-12 ENCOUNTER — Ambulatory Visit: Payer: Managed Care, Other (non HMO) | Admitting: Endocrinology

## 2011-05-12 DIAGNOSIS — Z0289 Encounter for other administrative examinations: Secondary | ICD-10-CM

## 2011-05-29 ENCOUNTER — Other Ambulatory Visit: Payer: Self-pay | Admitting: Gastroenterology

## 2011-06-05 ENCOUNTER — Other Ambulatory Visit: Payer: Self-pay | Admitting: Gastroenterology

## 2011-06-12 ENCOUNTER — Encounter: Payer: Self-pay | Admitting: Gastroenterology

## 2011-06-24 ENCOUNTER — Other Ambulatory Visit: Payer: Self-pay | Admitting: Gastroenterology

## 2011-08-17 ENCOUNTER — Encounter: Payer: Self-pay | Admitting: Family Medicine

## 2011-08-18 ENCOUNTER — Other Ambulatory Visit: Payer: Managed Care, Other (non HMO)

## 2011-08-18 ENCOUNTER — Encounter: Payer: Self-pay | Admitting: Gastroenterology

## 2011-08-18 ENCOUNTER — Ambulatory Visit (INDEPENDENT_AMBULATORY_CARE_PROVIDER_SITE_OTHER): Payer: Managed Care, Other (non HMO) | Admitting: Gastroenterology

## 2011-08-18 VITALS — BP 90/66 | HR 68 | Ht 60.0 in | Wt 98.2 lb

## 2011-08-18 DIAGNOSIS — R197 Diarrhea, unspecified: Secondary | ICD-10-CM

## 2011-08-18 DIAGNOSIS — K2270 Barrett's esophagus without dysplasia: Secondary | ICD-10-CM

## 2011-08-18 MED ORDER — OMEPRAZOLE 20 MG PO CPDR: 40.0000 mg | DELAYED_RELEASE_CAPSULE | Freq: Two times a day (BID) | ORAL | Status: DC

## 2011-08-18 NOTE — Progress Notes (Signed)
History of Present Illness: This is a 40 year old female returning for followup of GERD and Barrett's esophagus. She states she has frequent breakthrough symptoms occurring about 2-3 times per week. She has intermittent postprandial diarrhea. Denies weight loss, abdominal pain, constipation, change in stool caliber, melena, hematochezia, nausea, vomiting, dysphagia, chest pain.  Current Medications, Allergies, Past Medical History, Past Surgical History, Family History and Social History were reviewed in [employer] record.  Physical Exam: General: Well developed , well nourished, no acute distress Head: Normocephalic and atraumatic Eyes:  sclerae anicteric, EOMI Ears: Normal auditory acuity Mouth: No deformity or lesions Lungs: Clear throughout to auscultation Heart: Regular rate and rhythm; no murmurs, rubs or bruits Abdomen: Soft, non tender and non distended. No masses, hepatosplenomegaly or hernias noted. Normal Bowel sounds Musculoskeletal: Symmetrical with no gross deformities  Pulses:  Normal pulses noted Extremities: No clubbing, cyanosis, edema or deformities noted Neurological: Alert oriented x 4, grossly nonfocal Psychological:  Alert and cooperative. Normal mood and affect  Assessment and Recommendations:  1. GERD with Barrett's esophagus. Increase omeprazole to 40 mg twice a day taken 30 minutes before breakfast and dinner. Schedule endoscopy for Barrett's surveillance. The risks, benefits, and alternatives to endoscopy with possible biopsy and possible dilation were discussed with the patient and they consent to proceed.   2. Diarrhea. Celiac panel. Duodenal biopsies at upper endoscopy if the celiac panel is positive.  3. Fatty liver. Hepatic panel was normal in March. I suspect this has substantially improved with her weight loss. Ongoing management of hyperlipidemia and long-term low fat diet.

## 2011-08-18 NOTE — Patient Instructions (Addendum)
You have been given a separate informational sheet regarding your tobacco use, the importance of quitting and local resources to help you quit.  You have been scheduled for an endoscopy. Please follow written instructions given to you at your visit today.  We have sent the following medications to your pharmacy for you to pick up at your convenience: Omeprazole (Prilosec).   Your physician has requested that you go to the basement for the following labwork before leaving today:  cc: Loreen Freud, DO

## 2011-08-19 ENCOUNTER — Encounter: Payer: Self-pay | Admitting: Family Medicine

## 2011-08-19 ENCOUNTER — Ambulatory Visit (INDEPENDENT_AMBULATORY_CARE_PROVIDER_SITE_OTHER): Payer: Managed Care, Other (non HMO) | Admitting: Family Medicine

## 2011-08-19 DIAGNOSIS — R079 Chest pain, unspecified: Secondary | ICD-10-CM

## 2011-08-19 LAB — LIPID PANEL
Cholesterol: 281 mg/dL — ABNORMAL HIGH (ref 0–200)
HDL: 98.4 mg/dL (ref >39.00)
Triglycerides: 61 mg/dL (ref 0.0–149.0)

## 2011-08-19 LAB — CELIAC PANEL 10
Gliadin IgA: 3.4 U/mL (ref <20)
Gliadin IgG: 5 U/mL (ref <20)
Tissue Transglutaminase Ab, IgA: 3.4 U/mL (ref <20)

## 2011-08-19 LAB — BASIC METABOLIC PANEL
BUN: 11 mg/dL (ref 6–23)
Calcium: 9.6 mg/dL (ref 8.4–10.5)
GFR: 91.1 mL/min (ref >60.00)
Glucose, Bld: 90 mg/dL (ref 70–99)
Sodium: 142 mEq/L (ref 135–145)

## 2011-08-19 LAB — CBC WITH DIFFERENTIAL/PLATELET
Eosinophils Absolute: 0.3 10*3/uL (ref 0.0–0.7)
Eosinophils Relative: 5.7 % — ABNORMAL HIGH (ref 0.0–5.0)
Hemoglobin: 14.1 g/dL (ref 12.0–15.0)
Lymphocytes Relative: 56.2 % — ABNORMAL HIGH (ref 12.0–46.0)
Monocytes Relative: 8.8 % (ref 3.0–12.0)
Platelets: 174 10*3/uL (ref 150.0–400.0)
RDW: 12.9 % (ref 11.5–14.6)
WBC: 5.4 10*3/uL (ref 4.5–10.5)

## 2011-08-19 LAB — HEPATIC FUNCTION PANEL
ALT: 34 U/L (ref 0–35)
Albumin: 4.2 g/dL (ref 3.5–5.2)
Bilirubin, Direct: 0 mg/dL (ref 0.0–0.3)
Total Protein: 7.3 g/dL (ref 6.0–8.3)

## 2011-08-19 NOTE — Progress Notes (Signed)
  Subjective:    Patient ID: [name], female    DOB: Nov 29, 1970, 40 y.o.   MRN: [id number]  HPI Pt here to f/u from ER visit last Tuesday.  She was sitting at home Tuesday when she suddenly had pain L arm, back and down around and across ribs with sob.  Her husband called 911 and she went to [facility] ER.  Nothing was found but it was recommended that she have a stress test.  Pt has had no problems since  Review of Systems As above    Objective:   Physical Exam  Constitutional: She is oriented to person, place, and time. She appears well-developed and well-nourished.  Cardiovascular: Normal rate, regular rhythm and normal heart sounds.   Pulmonary/Chest: Effort normal and breath sounds normal.  Neurological: She is alert and oriented to person, place, and time.  Psychiatric: She has a normal mood and affect. Her behavior is normal. Judgment and thought content normal.          Assessment & Plan:  1.  Hx Chest pain with fam hx cad---none since last week, pt denies anxiety or inc stress                                ER recommended stress test                                Will refer to cardio for further evaluation

## 2011-08-19 NOTE — Patient Instructions (Signed)
Chest Pain (Nonspecific) It is often hard to give a specific diagnosis for the cause of chest pain. There is always a chance that your pain could be related to something serious, such as a heart attack or a blood clot in the lungs. You need to follow up with your caregiver for further evaluation. CAUSES   Heartburn.   Pneumonia or bronchitis.   Anxiety and stress.   Inflammation around your heart (pericarditis) or lung (pleuritis or pleurisy).   A blood clot in the lung.   A collapsed lung (pneumothorax). It can develop suddenly on its own (spontaneous pneumothorax) or from injury (trauma) to the chest.  The chest wall is composed of bones, muscles, and cartilage. Any of these can be the source of the pain.  The bones can be bruised by injury.   The muscles or cartilage can be strained by coughing or overwork.   The cartilage can be affected by inflammation and become sore (costochondritis).  DIAGNOSIS  Lab tests or other studies, such as X-rays, an EKG, stress testing, or cardiac imaging, may be needed to find the cause of your pain.  TREATMENT   Treatment depends on what may be causing your chest pain. Treatment may include:   Acid blockers for heartburn.   Anti-inflammatory medicine.   Pain medicine for inflammatory conditions.   Antibiotics if an infection is present.   You may be advised to change lifestyle habits. This includes stopping smoking and avoiding caffeine and chocolate.   You may be advised to keep your head raised (elevated) when sleeping. This reduces the chance of acid going backward from your stomach into your esophagus.   Most of the time, nonspecific chest pain will improve within 2 to 3 days with rest and mild pain medicine.  HOME CARE INSTRUCTIONS   If antibiotics were prescribed, take the full amount even if you start to feel better.   For the next few days, avoid physical activities that bring on chest pain. Continue physical activities as  directed.   Do not smoke cigarettes or drink alcohol until your symptoms are gone.   Only take over-the-counter or prescription medicine for pain, discomfort, or fever as directed by your caregiver.   Follow your caregiver's suggestions for further testing if your chest pain does not go away.   Keep any follow-up appointments you made. If you do not go to an appointment, you could develop lasting (chronic) problems with pain. If there is any problem keeping an appointment, you must call to reschedule.  SEEK MEDICAL CARE IF:   You think you are having problems from the medicine you are taking. Read your medicine instructions carefully.   Your chest pain does not go away, even after treatment.   You develop a rash with blisters on your chest.  SEEK IMMEDIATE MEDICAL CARE IF:   You have increased chest pain or pain that spreads to your arm, neck, jaw, back, or belly (abdomen).   You develop shortness of breath, an increasing cough, or you are coughing up blood.   You have severe back or abdominal pain, feel sick to your stomach (nauseous) or throw up (vomit).   You develop severe weakness, fainting, or chills.   You have an oral temperature above 102 F (38.9 C), not controlled by medicine.  THIS IS AN EMERGENCY. Do not wait to see if the pain will go away. Get medical help at once. Call your local emergency services (911 in [place] Do not drive yourself to   the hospital. MAKE SURE YOU:   Understand these instructions.   Will watch your condition.   Will get help right away if you are not doing well or get worse.  Document Released: 07/22/2005 Document Revised: 06/24/2011 Document Reviewed: 05/17/2008 [employer] Patient Information 2012 [employer]. 

## 2011-08-20 ENCOUNTER — Encounter: Payer: Self-pay | Admitting: Cardiology

## 2011-08-20 ENCOUNTER — Ambulatory Visit (INDEPENDENT_AMBULATORY_CARE_PROVIDER_SITE_OTHER): Payer: Managed Care, Other (non HMO) | Admitting: Cardiology

## 2011-08-20 DIAGNOSIS — R072 Precordial pain: Secondary | ICD-10-CM

## 2011-08-20 DIAGNOSIS — R0789 Other chest pain: Secondary | ICD-10-CM | POA: Insufficient documentation

## 2011-08-20 DIAGNOSIS — R079 Chest pain, unspecified: Secondary | ICD-10-CM

## 2011-08-20 DIAGNOSIS — E785 Hyperlipidemia, unspecified: Secondary | ICD-10-CM

## 2011-08-20 DIAGNOSIS — F17200 Nicotine dependence, unspecified, uncomplicated: Secondary | ICD-10-CM | POA: Insufficient documentation

## 2011-08-20 NOTE — Progress Notes (Signed)
HPI Patient presents for evaluation of chest discomfort. This happened last week. She had a left arm discomfort like there was a blood pressure cuff on her arm. She had some tingling that spread to the left chest. She went to an outside hospital and I did review the ER records. Cardiac enzymes were negative. Stress testing was suggested but she did not require admission. Since that time she has had no further discomfort. She had not had this kind of discomfort before. It was severe. She didn't describe associated symptoms. It lasted for 4-1/2 hours and went away spontaneously. She is not able to bring this on with the activity she does them she doesn't exercise routinely. She has had palpitations rarely. She had a severe episode in 2006 and reports being evaluated at that time with what sounds like a negative stress echocardiogram. She denies any shortness of breath, PND orthopnea. She does have low blood pressure and dizziness related to this which has been a chronic problem.  Allergies  Allergen Reactions  . Aspirin   . Ciprofloxacin   . Gabapentin     REACTION: SOB  . Nsaids     Current Outpatient Prescriptions  Medication Sig Dispense Refill  . Armodafinil (NUVIGIL) 250 MG tablet As needed once daily       . calcium carbonate (TUMS CALCIUM FOR LIFE BONE) 750 MG chewable tablet Chew 2 tablets by mouth daily.        . Cholecalciferol (VITAMIN D) 1000 UNITS capsule Take 1,000 Units by mouth 2 (two) times daily.        . Diclofenac Epolamine (FLECTOR) 1.3 % PTCH Place onto the skin daily.        . diclofenac sodium (VOLTAREN) 1 % GEL Apply topically. As directed       . fexofenadine (ALLEGRA) 180 MG tablet Take 180 mg by mouth daily.        . fluticasone (FLONASE) 50 MCG/ACT nasal spray 2 sprays by Nasal route daily.        Marland Kitchen FOLBEE 2.5-25-1 MG TABS       . isometheptene-acetaminophen-dichloralphenazone (MIDRIN) 65-325-100 MG capsule       . Melatonin 5 MG TABS Take 1 tablet by mouth at  bedtime.        . metaxalone (SKELAXIN) 800 MG tablet Take 400 mg by mouth 2 (two) times daily as needed.       . Omega-3 Fatty Acids (FISH OIL) 1200 MG CAPS Take 1 capsule by mouth 2 (two) times daily.        Marland Kitchen omeprazole (PRILOSEC) 20 MG capsule Take 2 capsules (40 mg total) by mouth 2 (two) times daily before a meal.  60 capsule  11  . pentosan polysulfate (ELMIRON) 100 MG capsule 200mg  two times a day       . promethazine (PHENERGAN) 25 MG tablet 1 tablet as needed for nausea      . SUMAVEL DOSEPRO 6 MG/0.5ML DEVI As needed for onset of migraine headache      . Tapentadol HCl (NUCYNTA) 75 MG TABS Take 2 tablets by mouth 3 (three) times daily.        . temazepam (RESTORIL) 7.5 MG capsule Take 2 capsules by mouth at bedtime       . tiZANidine (ZANAFLEX) 4 MG tablet       . traMADol (ULTRAM-ER) 100 MG 24 hr tablet Take 100 mg by mouth 3 (three) times daily.        . valACYclovir (VALTREX) 1000  MG tablet Take 1,000 mg by mouth daily.        . vitamin E 600 UNIT capsule Take 600 Units by mouth daily.          Past Medical History  Diagnosis Date  . 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 Goiter, unspecified 09/16/2007  . Polycystic ovaries 11/24/2006  . HYPERLIPIDEMIA 01/11/2008  . ANEMIA-IRON DEFICIENCY 02/10/2010  . OBSTRUCTIVE SLEEP APNEA 11/24/2006  . GERD 11/24/2006  . Barrett's esophagus 02/10/2010  . HIATAL HERNIA 06/26/2008  . FATTY LIVER DISEASE 06/14/2008  . HEPATOMEGALY, HX OF 02/23/2008  . HYPOGLYCEMIA 01/12/2011  . 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 FIBROMYALGIA 11/24/2006  . IRREGULAR MENSES 05/10/2007  . Chest pain     Past Surgical History  Procedure Date  . Cesarean section 1994  . Hernia repair 03/2006    Umbilical Hernia repair  . 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 Bladder suspension 02/18/2009  . Abdominal hysterectomy 02/18/2009    TAH BSO-prolaspe  . Nissen fundoplication     Family History  Problem Relation Age of  Onset  . Heart disease Father 30    angina  . Diabetes Father   . Hyperlipidemia Father   . Diabetes Maternal Grandmother   . Heart disease Maternal Grandmother   . Cancer Paternal Grandfather     Pancreatic Cancer  . Heart disease Mother     History   Social History  . Marital Status: Married    Spouse Name: N/A    Number of Children: 1  . Years of Education: N/A   Occupational History  .      Unemployed   Social History Main Topics  . Smoking status: Current Everyday Smoker -- 0.5 packs/day    Types: Cigarettes  . Smokeless tobacco: Never Used  . Alcohol Use: No  . Drug Use: No  . Sexually Active: Not on file   Other Topics Concern  . Not on file   Social History Narrative   Pt does not get regular exercise    ROS:  Positive for headaches, dizziness, nausea, diarrhea, constipation, Barrett's esophagus, hot flashes, muscle cramps, leg swelling, joint pains.  Otherwise as stated in the HPI and negative for all other systems.   PHYSICAL EXAM BP 93/65  Pulse 98  Ht 5' (1.524 m)  Wt 99 lb (44.906 kg)  BMI 19.33 kg/m2 GENERAL:  Well appearing HEENT:  Pupils equal round and reactive, fundi not visualized, oral mucosa unremarkable, dentures NECK:  No jugular venous distention, waveform within normal limits, carotid upstroke brisk and symmetric, no bruits, no thyromegaly LYMPHATICS:  No cervical, inguinal adenopathy LUNGS:  Clear to auscultation bilaterally BACK:  No CVA tenderness CHEST:  Unremarkable HEART:  PMI not displaced or sustained,S1 and S2 within normal limits, no S3, no S4, no clicks, no rubs, no murmurs ABD:  Flat, positive bowel sounds normal in frequency in pitch, no bruits, no rebound, no guarding, no midline pulsatile mass, no hepatomegaly, no splenomegaly EXT:  2 plus pulses throughout, no edema, no cyanosis no clubbing SKIN:  No rashes no nodules NEURO:  Cranial nerves II through XII grossly intact, motor grossly intact throughout [facility]:   Cognitively intact, oriented to person place and time   EKG:  08/11/11  Sinus rhythm, rate 84, axis within normal limits, intervals within normal limits, no acute ST-T wave changes.   ASSESSMENT AND PLAN

## 2011-08-20 NOTE — Assessment & Plan Note (Signed)
She has a plan to quit this.

## 2011-08-20 NOTE — Assessment & Plan Note (Signed)
Lab Results  Component Value Date   CHOL 281* 08/19/2011   HDL 98.40 08/19/2011   LDLDIRECT 173.7 08/19/2011   TRIG 61.0 08/19/2011   CHOLHDL 3 08/19/2011   I reviewed this with her.  She has been intolerant of Crestor. She has an exceptional HDL. In the absence of coronary disease I might not suggest further medical therapy.

## 2011-08-20 NOTE — Patient Instructions (Signed)
Your physician has requested that you have an exercise tolerance test. For further information please visit [email or web address]. Please also follow instruction sheet, as given.  The current medical regimen is effective;  continue present plan and medications.  

## 2011-08-20 NOTE — Assessment & Plan Note (Signed)
I reviewed the outside records. I think the pretest probability of obstructive coronary disease is low but she does have risk factors. I will bring the patient back for a POET (Plain Old Exercise Test). This will allow me to screen for obstructive coronary disease, risk stratify and very importantly provide a prescription for exercise.

## 2011-09-08 ENCOUNTER — Ambulatory Visit (INDEPENDENT_AMBULATORY_CARE_PROVIDER_SITE_OTHER): Payer: Managed Care, Other (non HMO) | Admitting: Physician Assistant

## 2011-09-08 DIAGNOSIS — R079 Chest pain, unspecified: Secondary | ICD-10-CM

## 2011-09-08 NOTE — Progress Notes (Signed)
Exercise Treadmill Test  Pre-Exercise Testing Evaluation Rhythm: normal sinus  Rate: 97   PR:  .12 QRS:  .08  QT:  .35 QTc: .44     Test  Exercise Tolerance Test Ordering MD: Angelina Sheriff, MD  Interpreting MD:  Tereso Newcomer PA-C  Unique Test No: 1  Treadmill:  1  Indication for ETT: chest pain - rule out ischemia  Contraindication to ETT: No   Stress Modality: exercise - treadmill  Cardiac Imaging Performed: non   Protocol: standard Bruce - maximal  Max BP:  130/79  Max MPHR (bpm):  181 85% MPR (bpm):  153  MPHR obtained (bpm):  125 % MPHR obtained:  69%  Reached 85% MPHR (min:sec):  Not achieved Total Exercise Time (min-sec):  6:38  Workload in METS:  7.9 Borg Scale: 14  Reason ETT Terminated:  patient's desire to stop    ST Segment Analysis At Rest: normal ST segments - no evidence of significant ST depression With Exercise: no evidence of significant ST depression  Other Information Arrhythmia:  No Angina during ETT:  absent (0) Quality of ETT:  non-diagnostic  ETT Interpretation:  normal - no evidence of ischemia by ST analysis at submaximal exercise  Comments: Poor exercise tolerance. Patient noted chest pain prior to test. Normal BP response to exercise. No ST-T changes to suggest ischemia at submaximal exercise.   Recommendations: Arrange Lexiscan Myoview.

## 2011-09-08 NOTE — Patient Instructions (Signed)
Your physician has requested that you have a lexiscan myoview  DX [phone] CHEST PAIN. For further information please visit [email or web address]. Please follow instruction sheet, as given.   

## 2011-09-15 ENCOUNTER — Ambulatory Visit (HOSPITAL_COMMUNITY): Payer: Managed Care, Other (non HMO) | Attending: Cardiovascular Disease | Admitting: Radiology

## 2011-09-15 DIAGNOSIS — E785 Hyperlipidemia, unspecified: Secondary | ICD-10-CM | POA: Insufficient documentation

## 2011-09-15 DIAGNOSIS — R002 Palpitations: Secondary | ICD-10-CM | POA: Insufficient documentation

## 2011-09-15 DIAGNOSIS — R110 Nausea: Secondary | ICD-10-CM | POA: Insufficient documentation

## 2011-09-15 DIAGNOSIS — R42 Dizziness and giddiness: Secondary | ICD-10-CM | POA: Insufficient documentation

## 2011-09-15 DIAGNOSIS — R079 Chest pain, unspecified: Secondary | ICD-10-CM

## 2011-09-15 DIAGNOSIS — M79609 Pain in unspecified limb: Secondary | ICD-10-CM | POA: Insufficient documentation

## 2011-09-15 DIAGNOSIS — R5383 Other fatigue: Secondary | ICD-10-CM | POA: Insufficient documentation

## 2011-09-15 DIAGNOSIS — Z8249 Family history of ischemic heart disease and other diseases of the circulatory system: Secondary | ICD-10-CM | POA: Insufficient documentation

## 2011-09-15 DIAGNOSIS — R0789 Other chest pain: Secondary | ICD-10-CM | POA: Insufficient documentation

## 2011-09-15 DIAGNOSIS — R5381 Other malaise: Secondary | ICD-10-CM | POA: Insufficient documentation

## 2011-09-15 DIAGNOSIS — F17200 Nicotine dependence, unspecified, uncomplicated: Secondary | ICD-10-CM | POA: Insufficient documentation

## 2011-09-15 MED ORDER — REGADENOSON 0.4 MG/5ML IV SOLN
0.4000 mg | Freq: Once | INTRAVENOUS | Status: AC
  Administered 2011-09-15: 0.4 mg via INTRAVENOUS

## 2011-09-15 MED ORDER — TECHNETIUM TC 99M TETROFOSMIN IV KIT
33.0000 | PACK | Freq: Once | INTRAVENOUS | Status: AC | PRN
  Administered 2011-09-15: 33 via INTRAVENOUS

## 2011-09-15 MED ORDER — TECHNETIUM TC 99M TETROFOSMIN IV KIT
11.0000 | PACK | Freq: Once | INTRAVENOUS | Status: AC | PRN
  Administered 2011-09-15: 11 via INTRAVENOUS

## 2011-09-15 NOTE — Progress Notes (Signed)
[facility] SITE 3 NUCLEAR MED [address] [place] [address] [phone]  Cardiology Nuclear Med Study  [name] is a 40 y.o. female [id number] 08/10/1971   Nuclear Med Background Indication for Stress Test:  Evaluation for Ischemia and 08/11/11 Post [facility] ED with (L) arm pain that  radiated  to chest, negative enzymes.  History:  '06 Stress Echo @ [facility]:OK per patient; 09/08/11 [id number] to achieve THR. Cardiac Risk Factors: Family History - CAD, Lipids and Smoker  Symptoms:  Chest Pain/Pressure and Tightness with Stress. (Last episode of discomfort was about 2-weeks ago), Diaphoresis, Dizziness, Chronic Fatigue, Nausea, Palpitations and Rapid HR   Nuclear Pre-Procedure Caffeine/Decaff Intake:  None NPO After: 9:00pm   Lungs:  Clear.  O2 SAT 99% on RA. IV 0.9% NS with Angio Cath:  20g  IV Site: R Antecubital x 1, tolerated well IV Started by:  Irean Hong, RN  Chest Size (in):  32 Cup Size: A  Height: 5' (1.524 m)  Weight:  98 lb (44.453 kg)  BMI:  Body mass index is 19.14 kg/(m^2). Tech Comments:  No medications taken today.    Nuclear Med Study 1 or 2 day study: 1 day  Stress Test Type:  Treadmill/Lexiscan  Reading MD: Charlton Haws, MD  Order Authorizing Provider:  Rollene Rotunda, MD, Tereso Newcomer, [facility]  Resting Radionuclide: Technetium 7m Tetrofosmin  Resting Radionuclide Dose: 11.0 mCi   Stress Radionuclide:  Technetium 69m Tetrofosmin  Stress Radionuclide Dose: 33.0 mCi           Stress Protocol Rest HR: 93 Stress HR: 142  Rest BP: Sitting:130/100  Standing:128/96 Stress BP: 146/112  Exercise Time (min): 2:00 METS: n/a   Predicted Max HR: 181 bpm % Max HR: 78.45 bpm Rate Pressure Product: 03474   Dose of Adenosine (mg):  n/a Dose of Lexiscan: 0.4 mg  Dose of Atropine (mg): n/a Dose of Dobutamine: n/a mcg/kg/min (at max HR)  Stress Test Technologist: Smiley Houseman, CMA-N  Nuclear Technologist:  Domenic Polite, CNMT      Rest Procedure:  Myocardial perfusion imaging was performed at rest 45 minutes following the intravenous administration of Technetium 35m Tetrofosmin.  Rest ECG: No acute changes.  Stress Procedure:  The patient received IV Lexiscan 0.4 mg over 15-seconds with concurrent low level exercise and then Technetium 58m Tetrofosmin was injected at 30-seconds while the patient continued walking one more minute.  There were no significant changes with Lexiscan, occasional PVC's.  Quantitative spect images were obtained after a 45-minute delay.  Stress ECG: No significant change from baseline ECG  QPS Raw Data Images:  Normal; no motion artifact; normal heart/lung ratio. Stress Images:  Normal homogeneous uptake in all areas of the myocardium. Rest Images:  Normal homogeneous uptake in all areas of the myocardium. Subtraction (SDS):  Normal Transient Ischemic Dilatation (Normal <1.22):  0.91 Lung/Heart Ratio (Normal <0.45):  0.28  Quantitative Gated Spect Images QGS EDV:  58 ml QGS ESV:  17 ml QGS cine images:  NL LV Function; NL Wall Motion QGS EF: 70%  Impression Exercise Capacity:  Lexiscan with low level exercise. BP Response:  Normal blood pressure response. Clinical Symptoms:  No chest pain. ECG Impression:  No significant ST segment change suggestive of ischemia. Comparison with Prior Nuclear Study: No previous nuclear study performed  Overall Impression:  Normal stress nuclear study.   Charlton Haws

## 2011-09-21 ENCOUNTER — Ambulatory Visit (AMBULATORY_SURGERY_CENTER): Payer: Managed Care, Other (non HMO) | Admitting: Gastroenterology

## 2011-09-21 ENCOUNTER — Encounter: Payer: Self-pay | Admitting: Gastroenterology

## 2011-09-21 VITALS — BP 106/77 | HR 89 | Temp 98.4°F | Resp 16 | Ht 60.0 in | Wt 98.0 lb

## 2011-09-21 DIAGNOSIS — K2270 Barrett's esophagus without dysplasia: Secondary | ICD-10-CM

## 2011-09-21 DIAGNOSIS — K210 Gastro-esophageal reflux disease with esophagitis: Secondary | ICD-10-CM

## 2011-09-21 MED ORDER — SODIUM CHLORIDE 0.9 % IV SOLN: 500.0000 mL | INTRAVENOUS | Status: DC

## 2011-09-21 NOTE — Patient Instructions (Signed)
Read the handouts given to you by your recovery room nurse.   You may resume your routine medications today.   You may eat when you get home.   Your biopsy results will be mailed to your home within two weeks.   If you have any questions,  Please call [phone].  Thank-you.

## 2011-09-21 NOTE — Progress Notes (Signed)
Patient did not experience any of the following events: a burn prior to discharge; a fall within the facility; wrong site/side/patient/procedure/implant event; or a hospital transfer or hospital admission upon discharge from the facility. (G8907) Patient did not have preoperative order for IV antibiotic SSI prophylaxis. (G8918)  

## 2011-09-22 ENCOUNTER — Telehealth: Payer: Self-pay | Admitting: Family Medicine

## 2011-09-22 ENCOUNTER — Telehealth: Payer: Self-pay | Admitting: *Deleted

## 2011-09-22 NOTE — Telephone Encounter (Signed)

## 2011-09-22 NOTE — Telephone Encounter (Signed)
Received copies from [facility],on 11.26.12 . Forwarded 2 pages to Dr. Rico Junker review.  sj

## 2011-09-24 ENCOUNTER — Encounter: Payer: Self-pay | Admitting: Cardiology

## 2011-09-27 ENCOUNTER — Encounter: Payer: Self-pay | Admitting: Gastroenterology

## 2011-09-28 NOTE — Progress Notes (Signed)
Summary: RIGHT RIB PAIN   Vital Signs:  Patient Profile:   40 Years Old Female CC:      right side rib pain Height:     60 inches Weight:      102.8 pounds O2 Sat:      100 % O2 treatment:    Room Air Temp:     98.5 degrees F oral Pulse rate:   96 / minute Resp:     18 per minute BP sitting:   102 / 76  (left arm) Cuff size:   regular  Pt. in pain?   yes    Location:   right rib    Intensity:   7    Type:       sharp  Vitals Entered By: Linton Flemings RN (April 11, 2011 11:26 AM)                   Updated Prior Medication List: Janean Sark ER 100 MG XR24H-TAB (TRAMADOL HCL) one tablet by mouth three times a day VALTREX 1 GM  TABS (VALACYCLOVIR HCL) 1 by mouth once daily SKELAXIN 800 MG  TABS (METAXALONE) one tab by mouth two times a day as needed TEMAZEPAM 7.5 MG  CAPS (TEMAZEPAM) Take 2 tablets by mouth at bedtime VITAMIN D 1000 UNIT  CAPS (CHOLECALCIFEROL) one cap two times a day FISH OIL 1200 MG CAPS (OMEGA-3 FATTY ACIDS) one capsule by mouth two times a day OMEPRAZOLE 20 MG  CPDR (OMEPRAZOLE) one tablet by mouth two times a day ELMIRON 100 MG CAPS (PENTOSAN POLYSULFATE SODIUM) 200mg  two times a day TUMS CALCIUM FOR LIFE BONE 750 MG CHEW (CALCIUM CARBONATE ANTACID) two tablets by mouth at bedtime FLECTOR 1.3 % PTCH (DICLOFENAC EPOLAMINE) once daily VITAMIN E 600 UNIT  CAPS (VITAMIN E) one capsule by mouth once daily VOLTAREN 1 % GEL (DICLOFENAC SODIUM) as directed MELATONIN 5 MG TABS (MELATONIN) one tablet by mouth at bedtime NUCYNTA 75 MG TABS (TAPENTADOL HCL) 2 by mouth three times a day NUVIGIL 250 MG TABS (ARMODAFINIL) as needed once daily VESICARE 5 MG TABS (SOLIFENACIN SUCCINATE) by mouth daily * AMITRIPTYLINE HCL pt usure of dose FLONASE 50 MCG/ACT SUSP (FLUTICASONE PROPIONATE) 2  sprays each nostril once daily ALLEGRA ALLERGY 180 MG TABS (FEXOFENADINE HCL) 1 by mouth once daily  Current Allergies (reviewed today): ! NEURONTIN (GABAPENTIN) ! NSAIDS ! ASA !  CIPROHistory of Present Illness Chief Complaint: right side rib pain History of Present Illness:  Subjective:  Patient complains of sudden onset sharp pain in right anterior/lateral chest two days ago, worse with movement and deep inspiration.  No cough or recent URI.  No shortness of breath.  Recalls no trauma to the area, although about 6 months ago she noticed anterior rib pain after reaching over a washing machine.  Her symptoms became worse after doing deep breathing exercises two days ago. She notes that Voltaren gel has been helpful.   REVIEW OF SYSTEMS Constitutional Symptoms      Denies fever, chills, night sweats, weight loss, weight gain, and fatigue.  Eyes       Complains of glasses.      Denies change in vision, eye pain, eye discharge, contact lenses, and eye surgery. Ear/Nose/Throat/Mouth       Denies hearing loss/aids, change in hearing, ear pain, ear discharge, dizziness, frequent runny nose, frequent nose bleeds, sinus problems, sore throat, hoarseness, and tooth pain or bleeding.  Respiratory       Denies dry cough, productive  cough, wheezing, shortness of breath, asthma, bronchitis, and emphysema/COPD.  Cardiovascular       Complains of chest pain.      Denies murmurs and tires easily with exhertion.      Comments: pain radiates to chest   Gastrointestinal       Denies stomach pain, nausea/vomiting, diarrhea, constipation, blood in bowel movements, and indigestion. Genitourniary       Denies painful urination, kidney stones, and loss of urinary control. Neurological       Denies paralysis, seizures, and fainting/blackouts. Musculoskeletal       Denies muscle pain, joint pain, joint stiffness, decreased range of motion, redness, swelling, muscle weakness, and gout.  Skin       Denies bruising, unusual mles/lumps or sores, and hair/skin or nail changes.  Psych       Denies mood changes, temper/anger issues, anxiety/stress, speech problems, depression, and sleep  problems. Other Comments: unknown injury, onset thursday. pain is sharp and constant, increase with movement   Past History:  Past Medical History: Reviewed history from 02/10/2010 and no changes required. 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 Caesarean section 1994 Roux-en-Y gastric bypass with takedown of Nissen fundoplication 03/2006 Umbilical hernia repair 03/2006 Nissen fundoplication Hysterectomy (02/18/2009)- TAH BSO-prolapse Bladder Sling--Dr MacDermot4/26/2010  Family History: Reviewed history from 06/14/2008 and no changes required. No FH of Colon Cancer: Family History of Pancreatic Cancer: Paternal Grandfather Family History of Diabetes: Father, Maternal Grandmother Family History of Heart Disease: Father  Social History: Occupation: Unemployeed [employer]- yes 1/2 ppd Alcohol Use - no Illicit Drug Use - no Patient does not get regular exercise.    Objective:  No acute distress.  She is alert and oriented  Eyes:  Pupils are equal, round, and reactive to light and accomodation.  Extraocular movement is intact.  Conjunctivae are not inflamed.  Neck:  Supple.  No adenopathy is present. Lungs:  Clear to auscultation.  Breath sounds are equal.  Chest:  Diffuse non-localized tenderness over the right anterior/lateral/inferior ribs.  No swelling or crepitance. Heart:  Regular rate and rhythm without murmurs, rubs, or gallops.  Abdomen:  Nontender without masses or hepatosplenomegaly.  Bowel sounds are present.  No CVA or  flank tenderness.  Skin:  No rash. X-ray right chest and ribs:  IMPRESSION: Right far lateral eighth or ninth rib fracture, with suggestion of minimal callus formation that may suggest this is in part subacute. No other abnormality is identified.  Assessment New Problems: CLOSED FRACTURE OF ONE RIB (ICD-807.01) RIB PAIN, RIGHT SIDED (ICD-786.50)  RECURRENT RIB PAIN (OLD HEALING FRACTURE)  Plan New Orders: T-DG Ribs Unilateral w/Chest*R* [71101] New Patient Level III [13086] Rib Belt [L0210] Pulse Oximetry (single measurment) [94760] Services provided After hours-Weekends-Holidays [99051] Planning Comments:   Dispensed rib belt.  May continue Voltaren Gel.  ([facility] information and instruction patient handout given)  Follow-up with PCP if develops shortness of breath.  Avoid heavy lifting.   The patient and/or caregiver has been counseled thoroughly with regard to medications prescribed including dosage, schedule, interactions, rationale for use, and possible side effects and they verbalize understanding.  Diagnoses and expected course of recovery discussed and will return if not improved as expected or if the  condition worsens. Patient and/or caregiver verbalized understanding.   Orders Added: 1)  T-DG Ribs Unilateral w/Chest*R* [71101] 2)  New Patient Level III [16109] 3)  Rib Belt [L0210] 4)  Pulse Oximetry (single measurment) [94760] 5)  Services provided After hours-Weekends-Holidays [99051]

## 2011-09-28 NOTE — Telephone Encounter (Signed)
  Phone Note Outgoing Call Call back at [facility] Phone [phone]   Call placed by: Emilio Math,  April 15, 2011 6:51 PM Call placed to: Patient Summary of Call: Patient is feeling better, rib belt is helping a lot!

## 2012-01-12 ENCOUNTER — Telehealth: Payer: Self-pay | Admitting: Gastroenterology

## 2012-01-12 NOTE — Telephone Encounter (Signed)
Received 2 pages. Sent to Dr. Russella Dar. SD 01/11/12.

## 2012-06-14 IMAGING — CR DG RIBS W/ CHEST 3+V BILAT
5 series · 5 of 5 positions shown · non-contrast
Comparison: 11/17/2005

CLINICAL DATA: Injury

BILATERAL RIBS AND CHEST - 4+ VIEW

[w chest pa]
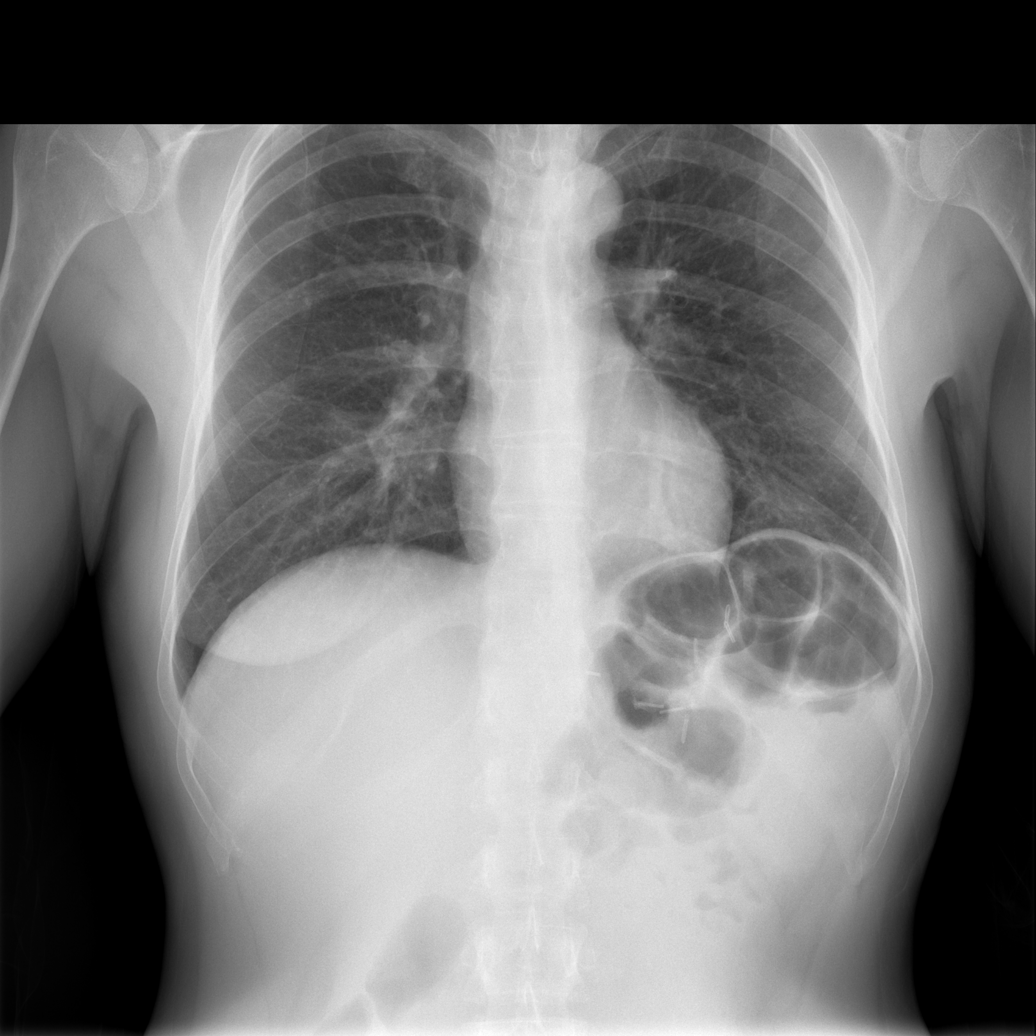

[w ribs ap/pa upper left]
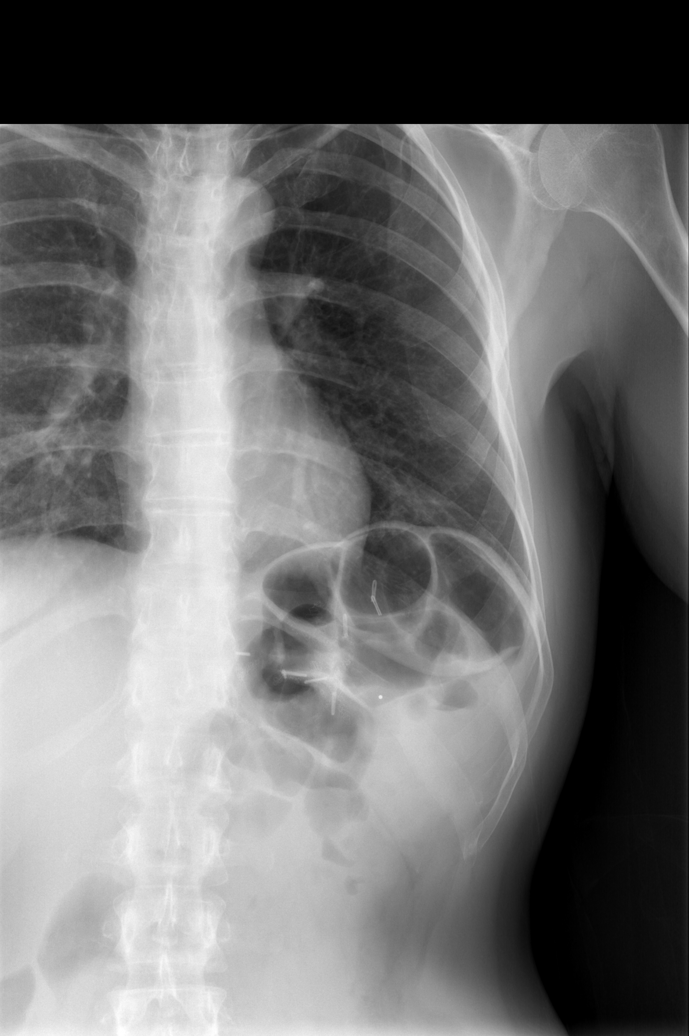

[w ribs oblique left]
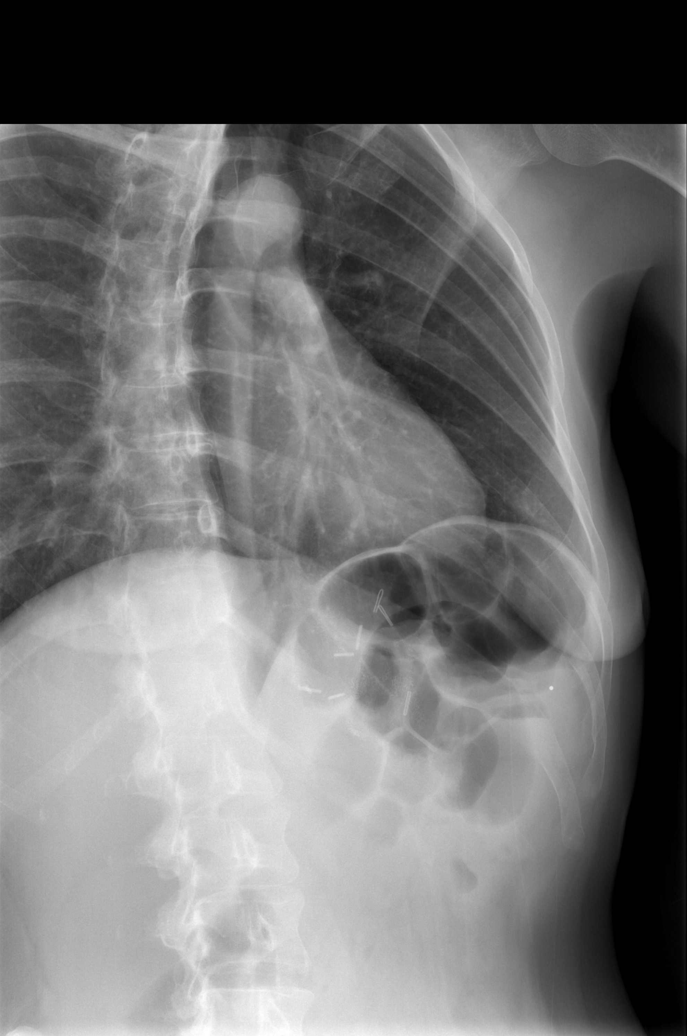

[w ribs ap/pa upper right]
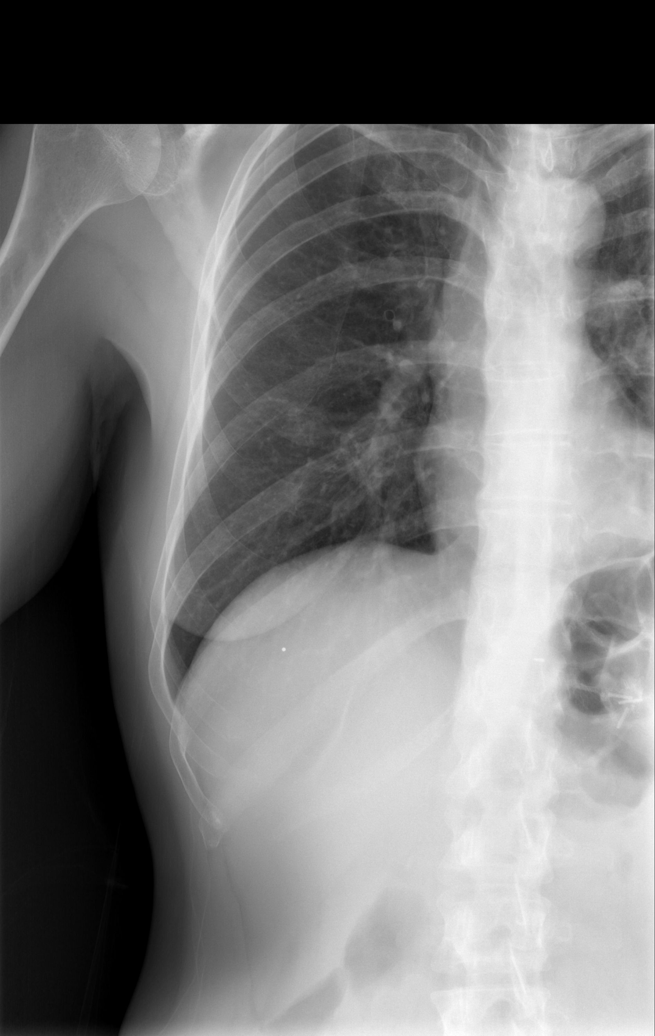

[w ribs oblique right]
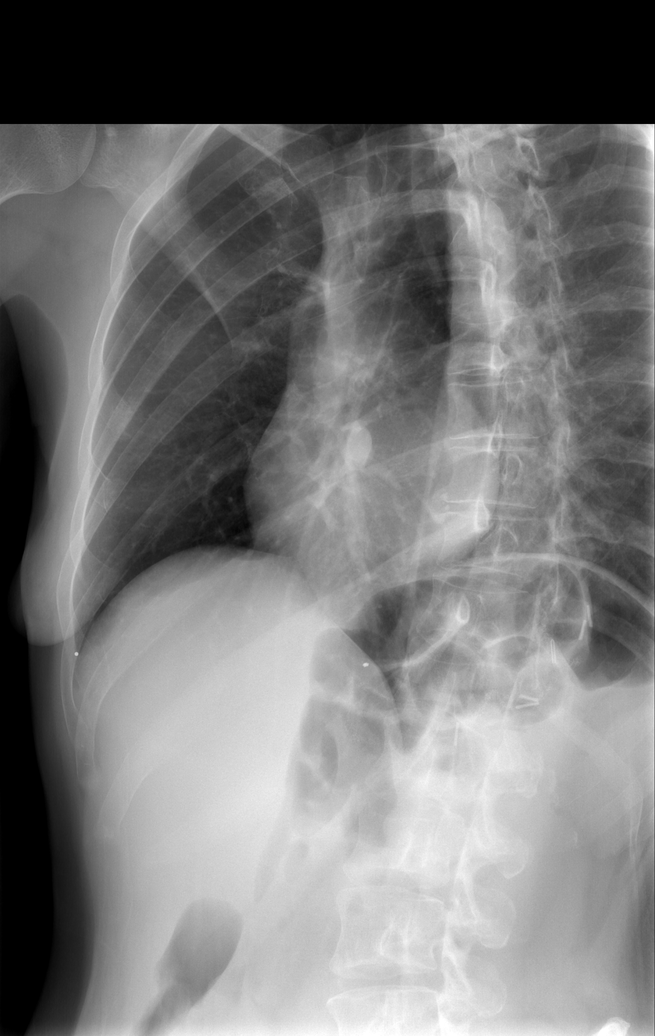

[5 of 5 positions shown; findings below may reference images not displayed]

FINDINGS: Heart is normal in size.  Lungs are clear.  No acute rib
fracture.  No pneumothorax.
IMPRESSION: No evidence of rib fracture and no pneumothorax.

## 2012-07-19 ENCOUNTER — Ambulatory Visit (INDEPENDENT_AMBULATORY_CARE_PROVIDER_SITE_OTHER): Payer: Managed Care, Other (non HMO) | Admitting: Family Medicine

## 2012-07-19 ENCOUNTER — Encounter: Payer: Self-pay | Admitting: Family Medicine

## 2012-07-19 VITALS — BP 100/64 | HR 76 | Temp 98.6°F | Wt 99.4 lb

## 2012-07-19 DIAGNOSIS — M2550 Pain in unspecified joint: Secondary | ICD-10-CM

## 2012-07-19 DIAGNOSIS — R5383 Other fatigue: Secondary | ICD-10-CM

## 2012-07-19 DIAGNOSIS — J309 Allergic rhinitis, unspecified: Secondary | ICD-10-CM

## 2012-07-19 DIAGNOSIS — Z9109 Other allergy status, other than to drugs and biological substances: Secondary | ICD-10-CM

## 2012-07-19 DIAGNOSIS — Z79899 Other long term (current) drug therapy: Secondary | ICD-10-CM

## 2012-07-19 DIAGNOSIS — R5381 Other malaise: Secondary | ICD-10-CM

## 2012-07-19 LAB — CBC WITH DIFFERENTIAL/PLATELET
Basophils Relative: 0.4 % (ref 0.0–3.0)
Eosinophils Absolute: 0.3 10*3/uL (ref 0.0–0.7)
Eosinophils Relative: 5.5 % — ABNORMAL HIGH (ref 0.0–5.0)
Hemoglobin: 14.5 g/dL (ref 12.0–15.0)
Lymphocytes Relative: 51.7 % — ABNORMAL HIGH (ref 12.0–46.0)
Monocytes Relative: 6.8 % (ref 3.0–12.0)
Neutro Abs: 1.8 10*3/uL (ref 1.4–7.7)
Neutrophils Relative %: 35.6 % — ABNORMAL LOW (ref 43.0–77.0)
RBC: 4.69 Mil/uL (ref 3.87–5.11)
WBC: 5 10*3/uL (ref 4.5–10.5)

## 2012-07-19 LAB — MAGNESIUM: Magnesium: 2.2 mg/dL (ref 1.5–2.5)

## 2012-07-19 LAB — COMPLETE METABOLIC PANEL WITH GFR
AST: 26 U/L (ref 0–37)
Albumin: 4.6 g/dL (ref 3.5–5.2)
BUN: 14 mg/dL (ref 6–23)
CO2: 29 mEq/L (ref 19–32)
Calcium: 10 mg/dL (ref 8.4–10.5)
Chloride: 103 mEq/L (ref 96–112)
Creat: 0.75 mg/dL (ref 0.50–1.10)
GFR, Est African American: >89 mL/min
Glucose, Bld: 72 mg/dL (ref 70–99)
Potassium: 4.7 mEq/L (ref 3.5–5.3)

## 2012-07-19 MED ORDER — MOMETASONE FUROATE 50 MCG/ACT NA SUSP: NASAL | Status: AC

## 2012-07-19 MED ORDER — NONFORMULARY OR COMPOUNDED ITEM: Status: AC

## 2012-07-19 NOTE — Patient Instructions (Signed)
Allergies, Generic Allergies may happen from anything your body is sensitive to. This may be food, medicines, pollens, chemicals, and nearly anything around you in everyday life that produces allergens. An allergen is anything that causes an allergy producing substance. Heredity is often a factor in causing these problems. This means you may have some of the same allergies as your parents. Food allergies happen in all age groups. Food allergies are some of the most severe and life threatening. Some common food allergies are cow's milk, seafood, eggs, nuts, wheat, and soybeans. SYMPTOMS   Swelling around the mouth.   An itchy red rash or hives.   Vomiting or diarrhea.   Difficulty breathing.  SEVERE ALLERGIC REACTIONS ARE LIFE-THREATENING. This reaction is called anaphylaxis. It can cause the mouth and throat to swell and cause difficulty with breathing and swallowing. In severe reactions only a trace amount of food (for example, peanut oil in a salad) may cause death within seconds. Seasonal allergies occur in all age groups. These are seasonal because they usually occur during the same season every year. They may be a reaction to molds, grass pollens, or tree pollens. Other causes of problems are house dust mite allergens, pet dander, and mold spores. The symptoms often consist of nasal congestion, a runny itchy nose associated with sneezing, and tearing itchy eyes. There is often an associated itching of the mouth and ears. The problems happen when you come in contact with pollens and other allergens. Allergens are the particles in the air that the body reacts to with an allergic reaction. This causes you to release allergic antibodies. Through a chain of events, these eventually cause you to release histamine into the blood stream. Although it is meant to be protective to the body, it is this release that causes your discomfort. This is why you were given anti-histamines to feel better. If you are  unable to pinpoint the offending allergen, it may be determined by skin or blood testing. Allergies cannot be cured but can be controlled with medicine. Hay fever is a collection of all or some of the seasonal allergy problems. It may often be treated with simple over-the-counter medicine such as diphenhydramine. Take medicine as directed. Do not drink alcohol or drive while taking this medicine. Check with your caregiver or package insert for child dosages. If these medicines are not effective, there are many new medicines your caregiver can prescribe. Stronger medicine such as nasal spray, eye drops, and corticosteroids may be used if the first things you try do not work well. Other treatments such as immunotherapy or desensitizing injections can be used if all else fails. Follow up with your caregiver if problems continue. These seasonal allergies are usually not life threatening. They are generally more of a nuisance that can often be handled using medicine. HOME CARE INSTRUCTIONS   If unsure what causes a reaction, keep a diary of foods eaten and symptoms that follow. Avoid foods that cause reactions.   If hives or rash are present:   Take medicine as directed.   You may use an over-the-counter antihistamine (diphenhydramine) for hives and itching as needed.   Apply cold compresses (cloths) to the skin or take baths in cool water. Avoid hot baths or showers. Heat will make a rash and itching worse.   If you are severely allergic:   Following a treatment for a severe reaction, hospitalization is often required for closer follow-up.   Wear a medic-alert bracelet or necklace stating the allergy.     You and your family must learn how to give adrenaline or use an anaphylaxis kit.   If you have had a severe reaction, always carry your anaphylaxis kit or EpiPen with you. Use this medicine as directed by your caregiver if a severe reaction is occurring. Failure to do so could have a fatal  outcome.  SEEK MEDICAL CARE IF:  You suspect a food allergy. Symptoms generally happen within 30 minutes of eating a food.   Your symptoms have not gone away within 2 days or are getting worse.   You develop new symptoms.   You want to retest yourself or your child with a food or drink you think causes an allergic reaction. Never do this if an anaphylactic reaction to that food or drink has happened before. Only do this under the care of a caregiver.  SEEK IMMEDIATE MEDICAL CARE IF:   You have difficulty breathing, are wheezing, or have a tight feeling in your chest or throat.   You have a swollen mouth, or you have hives, swelling, or itching all over your body.   You have had a severe reaction that has responded to your anaphylaxis kit or an EpiPen. These reactions may return when the medicine has worn off. These reactions should be considered life threatening.  MAKE SURE YOU:   Understand these instructions.   Will watch your condition.   Will get help right away if you are not doing well or get worse.  Document Released: 01/05/2003 Document Revised: 10/01/2011 Document Reviewed: 06/11/2008 [employer] Patient Information 2012 [employer]. 

## 2012-07-19 NOTE — Progress Notes (Signed)
  Subjective:     [name] is a 41 y.o. female who presents for evaluation and treatment of allergic symptoms. Symptoms include: clear rhinorrhea, itchy eyes, itchy nose, postnasal drip, sneezing and watery eyes and are present in a seasonal pattern. Precipitants include: weather. Treatment currently includes oral antihistamines: xyazl and is not effective. The following portions of the patient's history were reviewed and updated as appropriate: allergies, current medications, past family history, past medical history, past social history, past surgical history and problem list.  Review of Systems Pertinent items are noted in HPI.    Objective:    BP 100/64  Pulse 76  Temp 98.6 F (37 C) (Oral)  Wt 99 lb 6.4 oz (45.088 kg)  SpO2 98% General appearance: alert, cooperative, appears stated age and no distress Ears: normal TM's and external ear canals both ears Nose: clear discharge, moderate congestion, turbinates pink, swollen, no sinus tenderness Throat: lips, mucosa, and tongue normal; teeth and gums normal Neck: no adenopathy, supple, symmetrical, trachea midline and thyroid not enlarged, symmetric, no tenderness/mass/nodules Lungs: clear to auscultation bilaterally    Assessment:    Allergic rhinitis.    Plan:    Medications: intranasal steroids: nasonex, oral antihistamines: xyzal. Allergen avoidance discussed. Consider adding singulair if no better Follow-up in 3 months.

## 2012-07-20 LAB — VITAMIN D 25 HYDROXY (VIT D DEFICIENCY, FRACTURES): Vit D, 25-Hydroxy: 62 ng/mL (ref 30–89)

## 2012-08-05 ENCOUNTER — Telehealth: Payer: Self-pay | Admitting: Family Medicine

## 2012-08-05 NOTE — Telephone Encounter (Signed)
Received 1 pages from [facility]. Sent to Dr. Laury Axon. 08/05/12/sd

## 2012-08-25 ENCOUNTER — Other Ambulatory Visit: Payer: Self-pay | Admitting: Gastroenterology

## 2012-09-04 IMAGING — CR DG FOOT COMPLETE 3+V*L*
3 series · 3 of 3 positions shown · non-contrast
Comparison: None.

CLINICAL DATA: Injured foot last night

LEFT FOOT - COMPLETE 3+ VIEW

[t foot ap left]
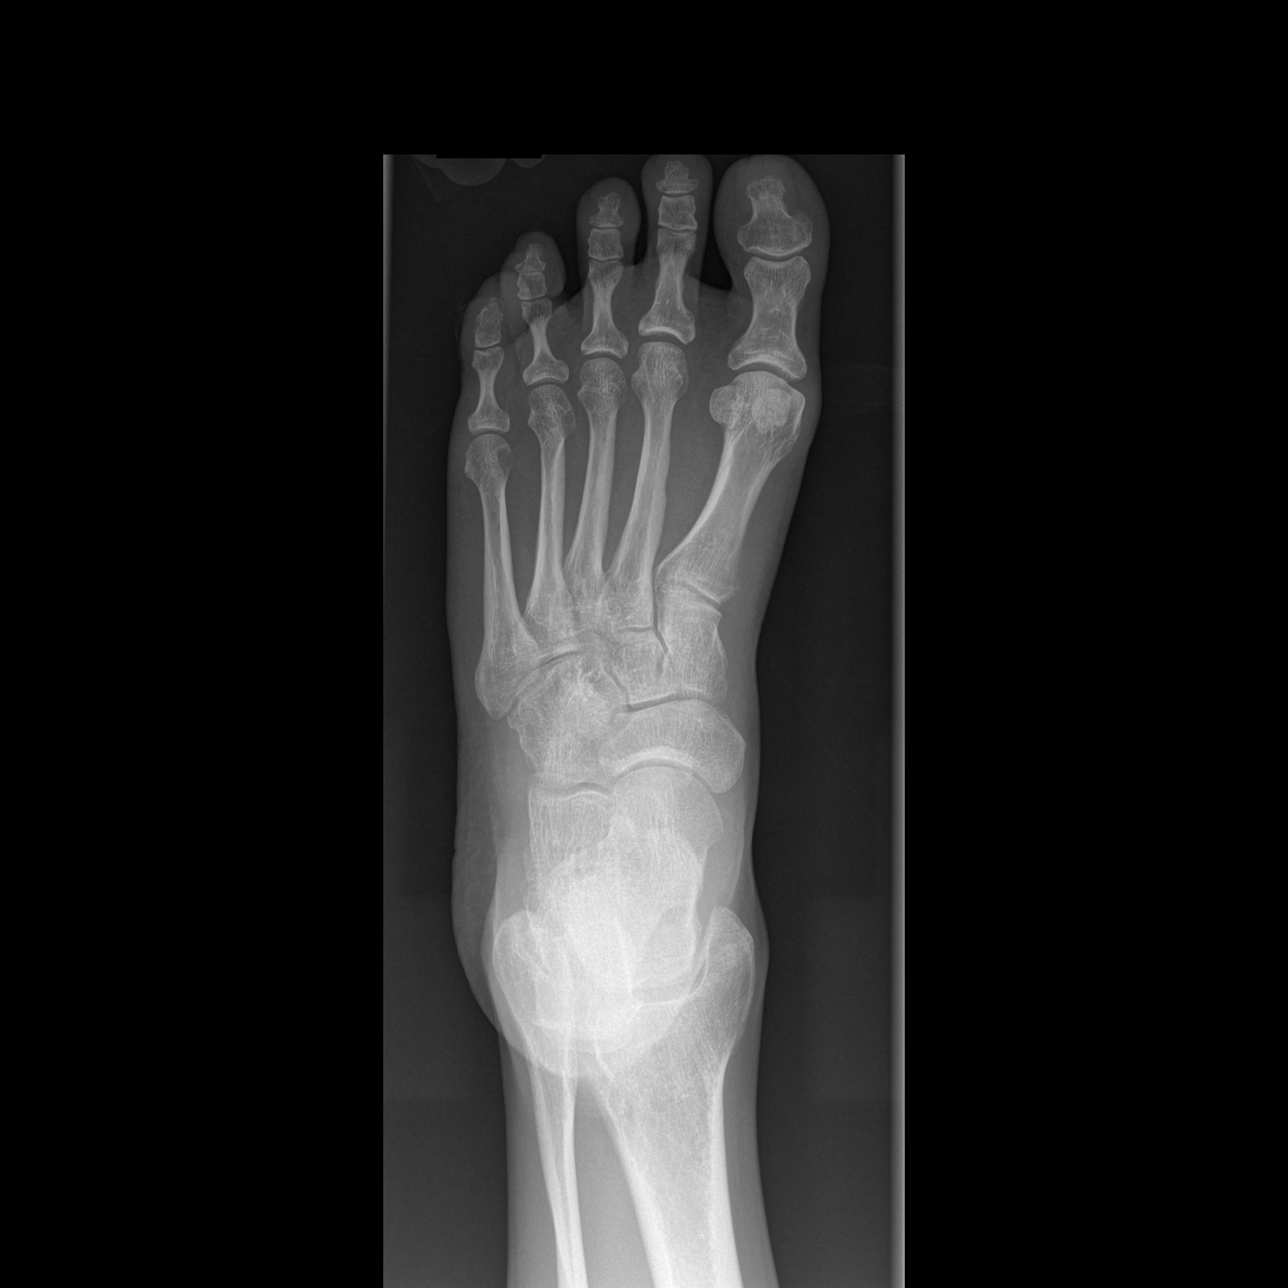

[t foot oblique left]
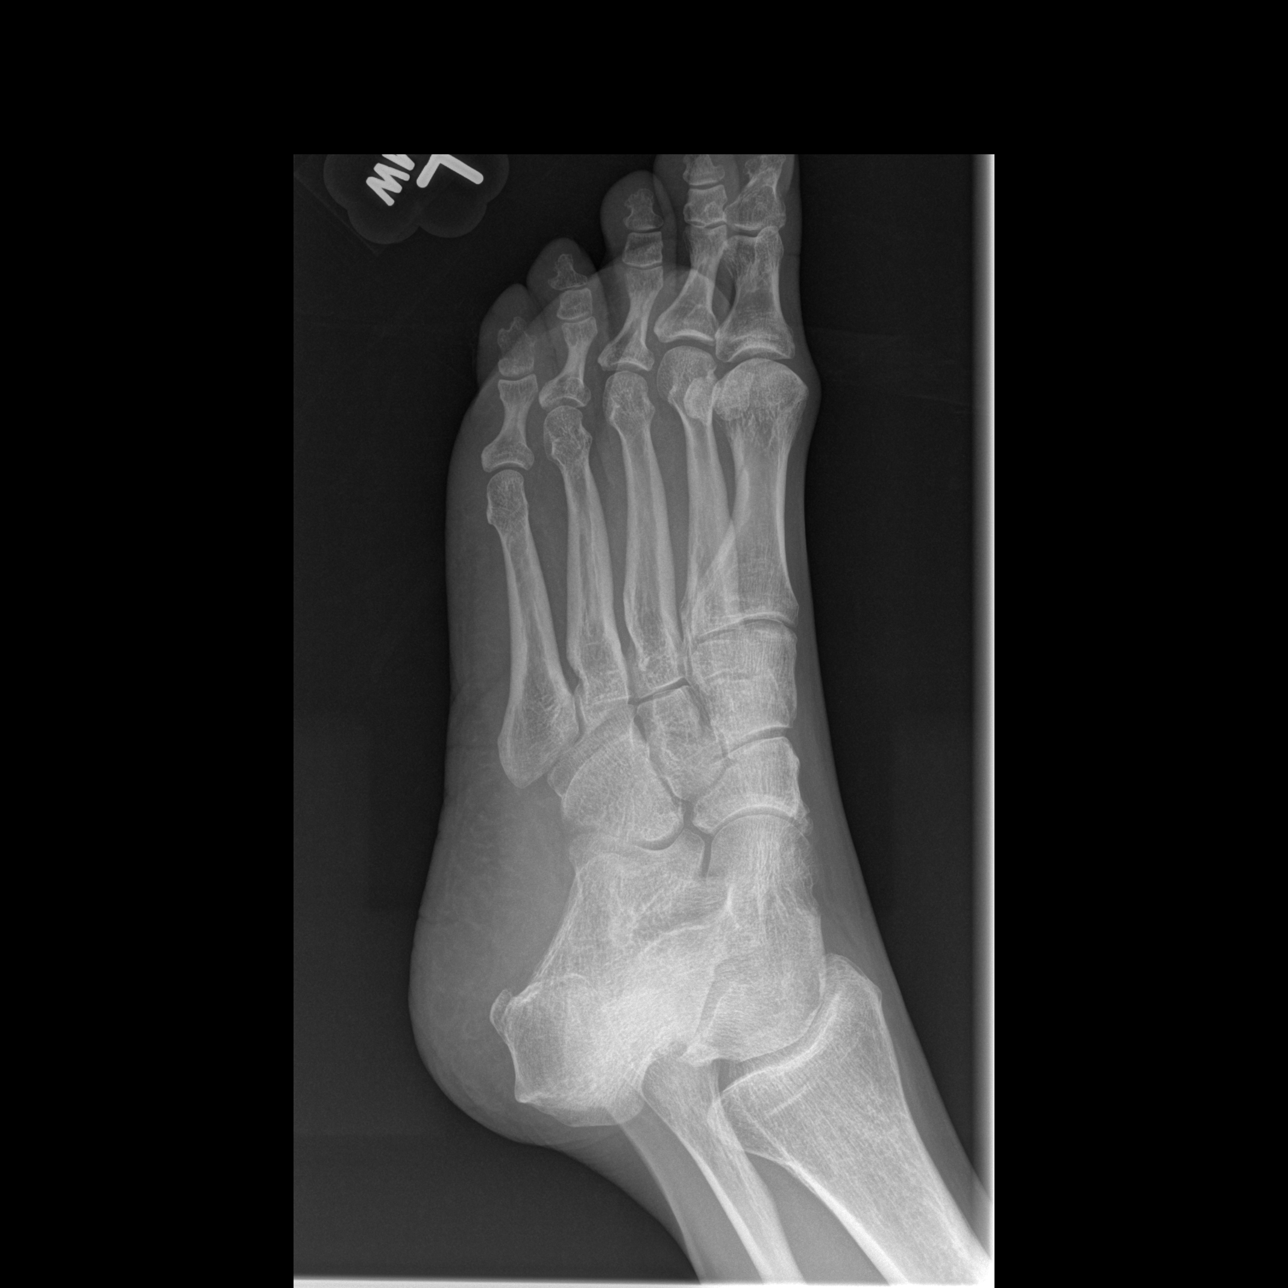

[t foot lat left]
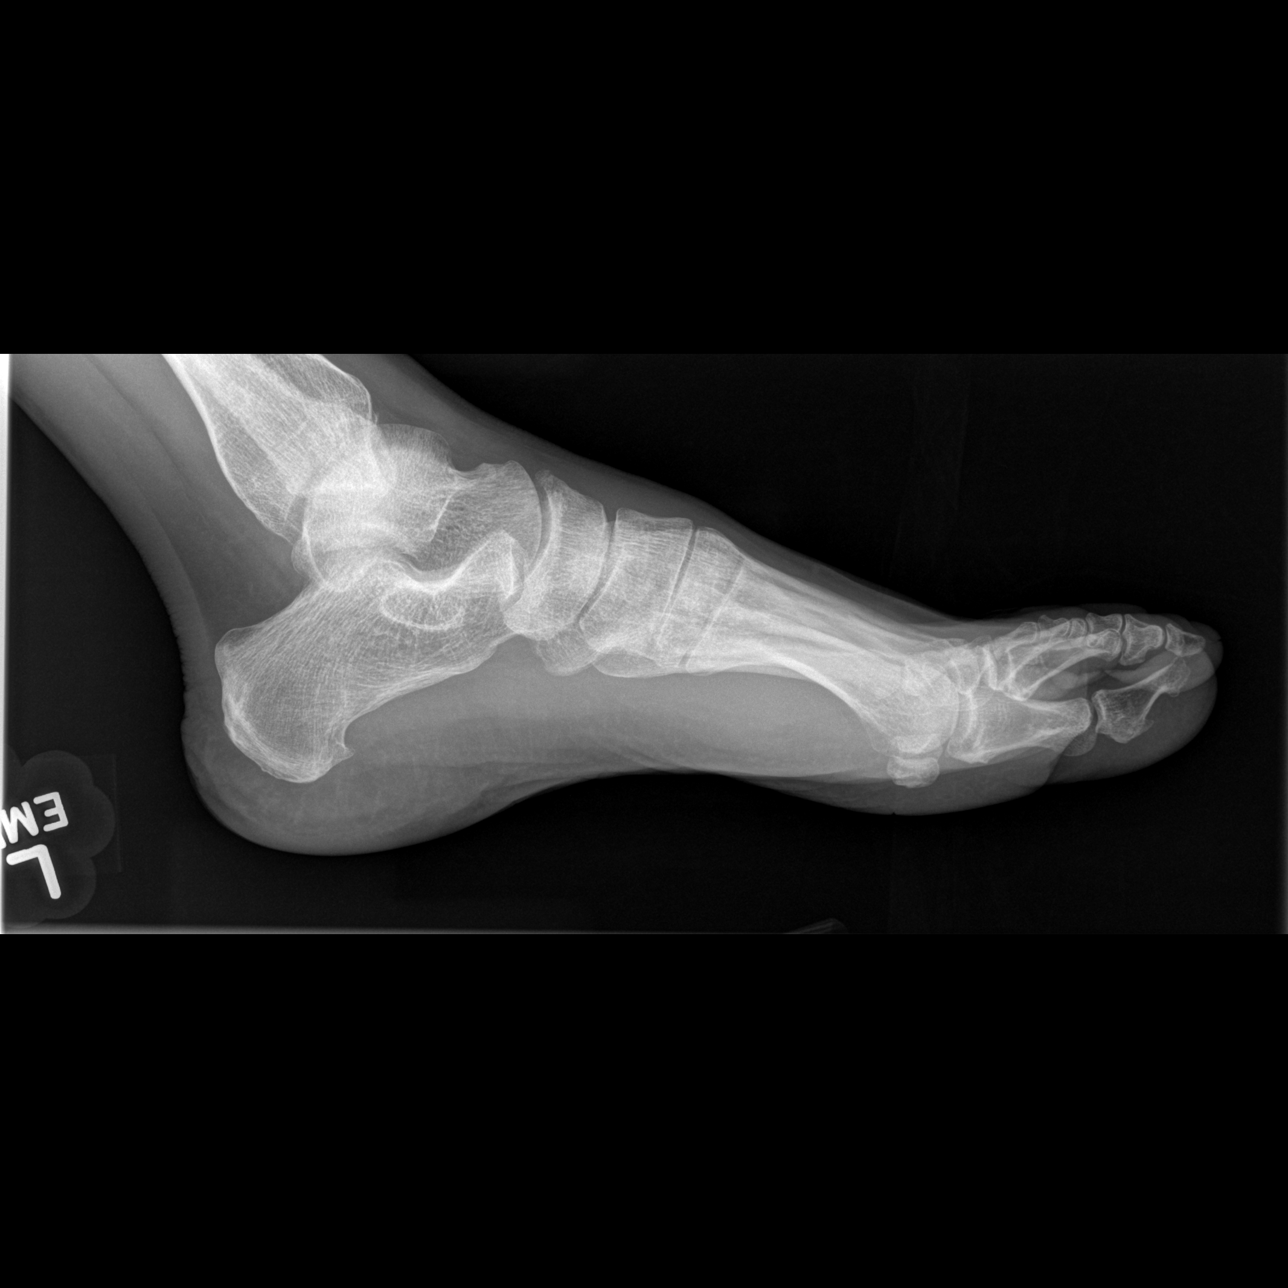

[3 of 3 positions shown; findings below may reference images not displayed]

FINDINGS: Tarsal - metatarsal alignment is normal.  No acute
fracture is seen.  Joint spaces appear normal.
IMPRESSION: Negative left foot.

## 2012-11-01 ENCOUNTER — Telehealth: Payer: Self-pay | Admitting: Family Medicine

## 2012-11-01 NOTE — Telephone Encounter (Signed)
Forward 1 page from [facility] to Dr. Loreen Freud for review on 11-01-12 ym

## 2013-01-27 ENCOUNTER — Telehealth: Payer: Self-pay | Admitting: Family Medicine

## 2013-01-27 NOTE — Telephone Encounter (Signed)
Received 1 page from [facility], sent to Loreen Freud, DO 01/27/13/ss

## 2013-02-28 ENCOUNTER — Other Ambulatory Visit: Payer: Self-pay | Admitting: Gastroenterology

## 2013-03-22 ENCOUNTER — Telehealth: Payer: Self-pay | Admitting: Family Medicine

## 2013-03-22 NOTE — Telephone Encounter (Signed)
Rec'd [facility] forward 1 page to [facility]

## 2013-04-14 ENCOUNTER — Other Ambulatory Visit: Payer: Self-pay | Admitting: Gastroenterology

## 2013-04-20 ENCOUNTER — Telehealth: Payer: Self-pay | Admitting: Family Medicine

## 2013-04-20 NOTE — Telephone Encounter (Signed)
Received 1 page from [facility], sent to Dr. Laury Axon. 04/20/13/ss

## 2013-07-13 ENCOUNTER — Telehealth: Payer: Self-pay | Admitting: Family Medicine

## 2013-07-13 NOTE — Telephone Encounter (Signed)
Rec'd from [facility] forward 2 pages to [facility]

## 2013-10-11 ENCOUNTER — Telehealth: Payer: Self-pay | Admitting: Family Medicine

## 2013-10-11 NOTE — Telephone Encounter (Signed)
Recd records from [facility], Forwarding 2pgs to [facility]

## 2014-01-02 ENCOUNTER — Telehealth: Payer: Self-pay | Admitting: Family Medicine

## 2014-01-02 NOTE — Telephone Encounter (Signed)
[facility] page from [facility], sent to Dr. Laury AxonLowne @ [address]. 01/02/14/ss

## 2014-01-25 ENCOUNTER — Telehealth: Payer: Self-pay | Admitting: Family Medicine

## 2014-01-25 NOTE — Telephone Encounter (Signed)
[facility] forward 4 pages to Dr. Laury AxonLowne

## 2014-07-03 ENCOUNTER — Encounter: Payer: Self-pay | Admitting: Gastroenterology

## 2014-08-22 ENCOUNTER — Emergency Department (HOSPITAL_BASED_OUTPATIENT_CLINIC_OR_DEPARTMENT_OTHER): Payer: 59

## 2014-08-22 ENCOUNTER — Encounter (HOSPITAL_BASED_OUTPATIENT_CLINIC_OR_DEPARTMENT_OTHER): Payer: Self-pay | Admitting: Emergency Medicine

## 2014-08-22 ENCOUNTER — Emergency Department (HOSPITAL_BASED_OUTPATIENT_CLINIC_OR_DEPARTMENT_OTHER)
Admission: EM | Admit: 2014-08-22 | Discharge: 2014-08-22 | Disposition: A | Discharge status: Home / Self Care | Payer: 59 | Attending: Emergency Medicine | Admitting: Emergency Medicine

## 2014-08-22 DIAGNOSIS — I10 Essential (primary) hypertension: Secondary | ICD-10-CM | POA: Insufficient documentation

## 2014-08-22 DIAGNOSIS — Z8639 Personal history of other endocrine, nutritional and metabolic disease: Secondary | ICD-10-CM | POA: Diagnosis not present

## 2014-08-22 DIAGNOSIS — Z780 Asymptomatic menopausal state: Secondary | ICD-10-CM | POA: Insufficient documentation

## 2014-08-22 DIAGNOSIS — W19XXXA Unspecified fall, initial encounter: Secondary | ICD-10-CM

## 2014-08-22 DIAGNOSIS — M1990 Unspecified osteoarthritis, unspecified site: Secondary | ICD-10-CM | POA: Diagnosis not present

## 2014-08-22 DIAGNOSIS — Z791 Long term (current) use of non-steroidal anti-inflammatories (NSAID): Secondary | ICD-10-CM | POA: Diagnosis not present

## 2014-08-22 DIAGNOSIS — W1839XA Other fall on same level, initial encounter: Secondary | ICD-10-CM | POA: Insufficient documentation

## 2014-08-22 DIAGNOSIS — K219 Gastro-esophageal reflux disease without esophagitis: Secondary | ICD-10-CM | POA: Diagnosis not present

## 2014-08-22 DIAGNOSIS — Z79899 Other long term (current) drug therapy: Secondary | ICD-10-CM | POA: Diagnosis not present

## 2014-08-22 DIAGNOSIS — Z7951 Long term (current) use of inhaled steroids: Secondary | ICD-10-CM | POA: Insufficient documentation

## 2014-08-22 DIAGNOSIS — Z87448 Personal history of other diseases of urinary system: Secondary | ICD-10-CM | POA: Insufficient documentation

## 2014-08-22 DIAGNOSIS — Z8669 Personal history of other diseases of the nervous system and sense organs: Secondary | ICD-10-CM | POA: Insufficient documentation

## 2014-08-22 DIAGNOSIS — Z720 Tobacco use: Secondary | ICD-10-CM | POA: Insufficient documentation

## 2014-08-22 DIAGNOSIS — Z862 Personal history of diseases of the blood and blood-forming organs and certain disorders involving the immune mechanism: Secondary | ICD-10-CM | POA: Insufficient documentation

## 2014-08-22 DIAGNOSIS — S299XXA Unspecified injury of thorax, initial encounter: Secondary | ICD-10-CM | POA: Diagnosis present

## 2014-08-22 DIAGNOSIS — S20211A Contusion of right front wall of thorax, initial encounter: Secondary | ICD-10-CM | POA: Diagnosis not present

## 2014-08-22 MED ORDER — OXYCODONE-ACETAMINOPHEN 5-325 MG PO TABS
2.0000 | ORAL_TABLET | Freq: Once | ORAL | Status: AC
  Administered 2014-08-22: 2 via ORAL
  Filled 2014-08-22: qty 2

## 2014-08-22 NOTE — ED Provider Notes (Signed)
CSN: [id number]     Arrival date & time 08/22/14  1726 History   First MD Initiated Contact with Patient 08/22/14 1736     Chief Complaint  Patient presents with  . Rib Injury     (Consider location/radiation/quality/duration/timing/severity/associated sxs/prior Treatment) HPI Comments: This is a 43 year old female who presents to the emergency department with her husband complaining of right-sided rib pain after a mechanical fall around 3:30-4:00 PM today. Patient reports her foot rolled off the side of the sidewalk causing her to fall and landed onto her right side. States her right arm pushed into her right ribs. Rib pain currently 10/10, worse with deep inspiration. Denies shortness of breath. She has not tried any alleviating factors for her symptoms. Denies arm pain.  The history is provided by the patient.    Past Medical History  Diagnosis Date  . 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 Goiter, unspecified 09/16/2007  . Polycystic ovaries 11/24/2006  . HYPERLIPIDEMIA 01/11/2008  . ANEMIA-IRON DEFICIENCY 02/10/2010  . OBSTRUCTIVE SLEEP APNEA 11/24/2006  . GERD 11/24/2006  . Barrett's esophagus 02/10/2010  . HIATAL HERNIA 06/26/2008  . FATTY LIVER DISEASE 06/14/2008  . HEPATOMEGALY, HX OF 02/23/2008  . HYPOGLYCEMIA 01/12/2011  . 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 FIBROMYALGIA 11/24/2006  . IRREGULAR MENSES 05/10/2007  . Chest pain   . Allergy     SEASONAL  . Arthritis     HANDS, FEET  . Hypertension     HX OF HTN. NO MEDICATION CURRENTLY   Past Surgical History  Procedure Laterality Date  . Cesarean section  1994  . Hernia repair  03/2006    Umbilical Hernia repair  . 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 Bladder suspension  02/18/2009  . Abdominal hysterectomy  02/18/2009    TAH BSO-prolaspe  . Nissen fundoplication     Family History  Problem Relation Age of Onset  . Heart disease Father 30    angina  . Diabetes Father   .  Hyperlipidemia Father   . Diabetes Maternal Grandmother   . Heart disease Maternal Grandmother   . Cancer Paternal Grandfather     Pancreatic Cancer  . Heart disease Mother     irregular heart beat, HTN   History  Substance Use Topics  . Smoking status: Current Every Day Smoker -- 1.00 packs/day for 11 years    Types: Cigarettes  . Smokeless tobacco: Never Used  . Alcohol Use: No   OB History   Grav Para Term Preterm Abortions TAB SAB Ect Mult Living                 Review of Systems  10 Systems reviewed and are negative for acute change except as noted in the HPI.  Allergies  Aspirin; Ciprofloxacin; Gabapentin; and Nsaids  Home Medications   Prior to Admission medications   Medication Sig Start Date End Date Taking? Authorizing Provider  Armodafinil (NUVIGIL) 250 MG tablet As needed once daily     Historical Provider, MD  buprenorphine (BUTRANS) 20 MCG/HR PTWK Place 20 mcg onto the skin once a week.    Historical Provider, MD  calcium carbonate (TUMS CALCIUM FOR LIFE BONE) 750 MG chewable tablet Chew 2 tablets by mouth daily.      Historical Provider, MD  Cholecalciferol (VITAMIN D) 1000 UNITS capsule Take 1,000 Units by mouth 2 (two) times daily.      Historical Provider, MD  diclofenac sodium (VOLTAREN) 1 %  GEL Apply topically. As directed     Historical Provider, MD  FOLBEE 2.5-25-1 MG TABS  08/10/11   Historical Provider, MD  HYDROcodone-acetaminophen (NORCO) 10-325 MG per tablet Take 1 tablet by mouth every 6 (six) hours as needed. 07/06/12   Historical Provider, MD  Melatonin 5 MG TABS Take 1 tablet by mouth at bedtime.      Historical Provider, MD  mometasone (NASONEX) 50 MCG/ACT nasal spray 2 sprays each nostril qd 07/19/12   Lelon PerlaYvonne R Lowne, DO  NONFORMULARY OR COMPOUNDED ITEM Chlor trimeton 4mg    2p o q4-6 h prn 07/19/12   Lelon PerlaYvonne R Lowne, DO  Omega-3 Fatty Acids (FISH OIL) 1200 MG CAPS Take 1 capsule by mouth 2 (two) times daily.      Historical Provider, MD   omeprazole (PRILOSEC) 20 MG capsule TAKE 2 CAPSULES TWICE A DAY BEFORE A MEAL 08/25/12   Meryl DareMalcolm T Stark, MD  pentosan polysulfate (ELMIRON) 100 MG capsule 200mg  two times a day     Historical Provider, MD  promethazine (PHENERGAN) 25 MG tablet 1 tablet as needed for nausea 12/03/10   Historical Provider, MD  SUMAVEL DOSEPRO 6 MG/0.5ML DEVI As needed for onset of migraine headache 02/03/11   Historical Provider, MD  temazepam (RESTORIL) 7.5 MG capsule Take 2 capsules by mouth at bedtime     Historical Provider, MD  tiZANidine (ZANAFLEX) 4 MG tablet  07/29/11   Historical Provider, MD  traMADol (ULTRAM-ER) 100 MG 24 hr tablet Take 100 mg by mouth 3 (three) times daily.      Historical Provider, MD  valACYclovir (VALTREX) 1000 MG tablet Take 1,000 mg by mouth daily.      Historical Provider, MD  vitamin E 600 UNIT capsule Take 600 Units by mouth daily.      Historical Provider, MD   BP 116/79  Pulse 88  Temp(Src) 98.4 F (36.9 C) (Oral)  Resp 16  Ht 5' (1.524 m)  Wt 145 lb (65.772 kg)  BMI 28.32 kg/m2  SpO2 100% Physical Exam  Nursing note and vitals reviewed. Constitutional: She is oriented to person, place, and time. She appears well-developed and well-nourished. No distress.  HENT:  Head: Normocephalic and atraumatic.  Mouth/Throat: Oropharynx is clear and moist.  Eyes: Conjunctivae and EOM are normal.  Neck: Normal range of motion. Neck supple.  Cardiovascular: Normal rate, regular rhythm and normal heart sounds.   Pulmonary/Chest: Effort normal and breath sounds normal. No respiratory distress. She has no decreased breath sounds. She has no wheezes. She has no rhonchi. She has no rales.  Tender to palpation right lower anterior, lateral and posterior lateral ribs. No bruising or signs of trauma. No crepitus or step-off.  Musculoskeletal: Normal range of motion. She exhibits no edema.  Neurological: She is alert and oriented to person, place, and time. No sensory deficit.  Skin: Skin  is warm and dry.  Psychiatric: She has a normal mood and affect. Her behavior is normal.    ED Course  Procedures (including critical care time) Labs Review Labs Reviewed - No data to display  Imaging Review Dg Ribs Unilateral W/chest Right  08/22/2014   CLINICAL DATA:  Larey SeatFell today.  Injured right ribs.  EXAM: RIGHT RIBS AND CHEST - 3+ VIEW  COMPARISON:  None.  FINDINGS: No acute soft tissue or bony abnormality identified. No evidence of pneumothorax.  IMPRESSION: No acute abnormality.   Electronically Signed   By: Maisie Fushomas  Register   On: 08/22/2014 17:58  EKG Interpretation None      MDM   Final diagnoses:  Rib contusion, right, initial encounter   patient presenting with right-sided rib pain after fall. No bruising or signs of trauma. No crepitus or step-off. Lungs clear. Right-sided rib x-rays with chest normal. Advised her to ice and rest. She has narcotic pain medication at home that she will take. Follow-up with PCP. Stable for discharge. Return precautions given. Patient states understanding of treatment care plan and is agreeable.  Kathrynn Speed, PA-C 08/22/14 [phone]

## 2014-08-22 NOTE — ED Provider Notes (Signed)
Medical screening examination/treatment/procedure(s) were performed by non-physician practitioner and as supervising physician I was immediately available for consultation/collaboration.   EKG Interpretation None        Warnell Foresterrey Caymen Dubray, MD 08/22/14 [phone]

## 2014-08-22 NOTE — ED Notes (Signed)
Pt c/o fall x 1 hr ago with rib injury.

## 2014-08-22 NOTE — Discharge Instructions (Signed)
Be sure to apply ice to your ribs.  Rib Contusion A rib contusion (bruise) can occur by a blow to the chest or by a fall against a hard object. Usually these will be much better in a couple weeks. If X-rays were taken today and there are no broken bones (fractures), the diagnosis of bruising is made. However, broken ribs may not show up for several days, or may be discovered later on a routine X-ray when signs of healing show up. If this happens to you, it does not mean that something was missed on the X-ray, but simply that it did not show up on the first X-rays. Earlier diagnosis will not usually change the treatment. HOME CARE INSTRUCTIONS   Avoid strenuous activity. Be careful during activities and avoid bumping the injured ribs. Activities that pull on the injured ribs and cause pain should be avoided, if possible.  For the first day or two, an ice pack used every 20 minutes while awake may be helpful. Put ice in a plastic bag and put a towel between the bag and the skin.  Eat a normal, well-balanced diet. Drink plenty of fluids to avoid constipation.  Take deep breaths several times a day to keep lungs free of infection. Try to cough several times a day. Splint the injured area with a pillow while coughing to ease pain. Coughing can help prevent pneumonia.  Wear a rib belt or binder only if told to do so by your caregiver. If you are wearing a rib belt or binder, you must do the breathing exercises as directed by your caregiver. If not used properly, rib belts or binders restrict breathing which can lead to pneumonia.  Only take over-the-counter or prescription medicines for pain, discomfort, or fever as directed by your caregiver. SEEK MEDICAL CARE IF:   You or your child has an oral temperature above 102 F (38.9 C).  Your baby is older than 3 months with a rectal temperature of 100.5 F (38.1 C) or higher for more than 1 day.  You develop a cough, with thick or bloody sputum. SEEK  IMMEDIATE MEDICAL CARE IF:   You have difficulty breathing.  You feel sick to your stomach (nausea), have vomiting or belly (abdominal) pain.  You have worsening pain, not controlled with medications, or there is a change in the location of the pain.  You develop sweating or radiation of the pain into the arms, jaw or shoulders, or become light headed or faint.  You or your child has an oral temperature above 102 F (38.9 C), not controlled by medicine.  Your or your baby is older than 3 months with a rectal temperature of 102 F (38.9 C) or higher.  Your baby is 423 months old or younger with a rectal temperature of 100.4 F (38 C) or higher. MAKE SURE YOU:   Understand these instructions.  Will watch your condition.  Will get help right away if you are not doing well or get worse. Document Released: 07/07/2001 Document Revised: 02/06/2013 Document Reviewed: 05/30/2008 [facility] Patient Information 2015 [employer] [place]. This information is not intended to replace advice given to you by your health care provider. Make sure you discuss any questions you have with your health care provider.

## 2014-08-22 NOTE — ED Notes (Signed)
PA at bedside with this RN.

## 2015-01-28 ENCOUNTER — Encounter: Payer: Self-pay | Admitting: Gastroenterology

## 2015-03-26 ENCOUNTER — Encounter: Payer: Self-pay | Admitting: Gastroenterology

## 2021-12-31 ENCOUNTER — Encounter: Payer: Self-pay | Admitting: Gastroenterology
